# Patient Record
Sex: Female | Born: 1935 | Race: White | Hispanic: No | Marital: Married
Health system: Southern US, Community
[De-identification: ages and names within clinical notes are randomized; demographics above are authoritative.]

---

## 2004-05-17 ENCOUNTER — Other Ambulatory Visit: Payer: Self-pay

## 2004-05-17 ENCOUNTER — Emergency Department: Payer: Self-pay | Admitting: Emergency Medicine

## 2004-05-21 ENCOUNTER — Ambulatory Visit: Payer: Self-pay

## 2004-07-24 ENCOUNTER — Emergency Department: Payer: Self-pay | Admitting: Emergency Medicine

## 2004-08-10 ENCOUNTER — Encounter: Payer: Self-pay | Admitting: Family Medicine

## 2004-08-22 ENCOUNTER — Encounter: Payer: Self-pay | Admitting: Family Medicine

## 2005-01-26 ENCOUNTER — Ambulatory Visit: Payer: Self-pay | Admitting: Family Medicine

## 2005-07-22 ENCOUNTER — Emergency Department: Payer: Self-pay | Admitting: Emergency Medicine

## 2005-10-31 ENCOUNTER — Other Ambulatory Visit: Payer: Self-pay

## 2005-11-01 ENCOUNTER — Inpatient Hospital Stay: Payer: Self-pay

## 2006-02-22 ENCOUNTER — Ambulatory Visit: Payer: Self-pay | Admitting: Family Medicine

## 2006-10-05 ENCOUNTER — Encounter: Payer: Self-pay | Admitting: Family Medicine

## 2006-10-23 ENCOUNTER — Encounter: Payer: Self-pay | Admitting: Family Medicine

## 2006-11-22 ENCOUNTER — Encounter: Payer: Self-pay | Admitting: Family Medicine

## 2006-12-23 ENCOUNTER — Encounter: Payer: Self-pay | Admitting: Family Medicine

## 2007-04-07 ENCOUNTER — Ambulatory Visit: Payer: Self-pay | Admitting: Family Medicine

## 2007-05-31 ENCOUNTER — Inpatient Hospital Stay: Payer: Self-pay | Admitting: Internal Medicine

## 2007-05-31 ENCOUNTER — Other Ambulatory Visit: Payer: Self-pay

## 2007-06-07 ENCOUNTER — Encounter: Payer: Self-pay | Admitting: Internal Medicine

## 2007-06-25 ENCOUNTER — Encounter: Payer: Self-pay | Admitting: Internal Medicine

## 2007-07-05 ENCOUNTER — Ambulatory Visit: Payer: Self-pay | Admitting: Family Medicine

## 2007-07-23 ENCOUNTER — Encounter: Payer: Self-pay | Admitting: Internal Medicine

## 2007-08-24 ENCOUNTER — Ambulatory Visit: Payer: Self-pay | Admitting: Family Medicine

## 2007-08-31 ENCOUNTER — Encounter: Payer: Self-pay | Admitting: Internal Medicine

## 2007-09-22 ENCOUNTER — Encounter: Payer: Self-pay | Admitting: Internal Medicine

## 2007-12-07 ENCOUNTER — Ambulatory Visit: Payer: Self-pay | Admitting: Family Medicine

## 2008-03-26 ENCOUNTER — Ambulatory Visit: Payer: Self-pay | Admitting: Family Medicine

## 2008-05-03 ENCOUNTER — Encounter: Payer: Self-pay | Admitting: Family Medicine

## 2008-05-24 ENCOUNTER — Encounter: Payer: Self-pay | Admitting: Family Medicine

## 2008-07-09 ENCOUNTER — Encounter: Payer: Self-pay | Admitting: Family Medicine

## 2008-07-15 ENCOUNTER — Ambulatory Visit: Payer: Self-pay | Admitting: Internal Medicine

## 2008-07-22 ENCOUNTER — Encounter: Payer: Self-pay | Admitting: Family Medicine

## 2009-05-19 ENCOUNTER — Encounter: Admission: RE | Admit: 2009-05-19 | Discharge: 2009-05-19 | Payer: Self-pay | Admitting: Neurosurgery

## 2009-07-03 ENCOUNTER — Inpatient Hospital Stay (HOSPITAL_COMMUNITY): Admission: RE | Admit: 2009-07-03 | Discharge: 2009-07-05 | Payer: Self-pay | Admitting: Neurosurgery

## 2009-08-07 ENCOUNTER — Encounter: Admission: RE | Admit: 2009-08-07 | Discharge: 2009-08-07 | Payer: Self-pay | Admitting: Neurosurgery

## 2009-08-26 ENCOUNTER — Encounter: Admission: RE | Admit: 2009-08-26 | Discharge: 2009-08-26 | Payer: Self-pay | Admitting: Neurosurgery

## 2009-09-16 ENCOUNTER — Encounter: Admission: RE | Admit: 2009-09-16 | Discharge: 2009-09-16 | Payer: Self-pay | Admitting: Neurosurgery

## 2009-10-07 ENCOUNTER — Encounter: Admission: RE | Admit: 2009-10-07 | Discharge: 2009-10-07 | Payer: Self-pay | Admitting: Neurosurgery

## 2009-12-23 ENCOUNTER — Emergency Department: Payer: Self-pay | Admitting: Emergency Medicine

## 2010-03-26 ENCOUNTER — Ambulatory Visit: Payer: Self-pay

## 2010-04-15 ENCOUNTER — Encounter: Admission: RE | Admit: 2010-04-15 | Discharge: 2010-04-15 | Payer: Self-pay | Admitting: Neurosurgery

## 2010-08-13 LAB — BASIC METABOLIC PANEL
CO2: 27 mEq/L (ref 19–32)
Calcium: 9.7 mg/dL (ref 8.4–10.5)
Creatinine, Ser: 1.09 mg/dL (ref 0.4–1.2)
GFR calc Af Amer: 59 mL/min — ABNORMAL LOW (ref >60)

## 2010-08-13 LAB — CBC
MCHC: 34.6 g/dL (ref 30.0–36.0)
Platelets: 173 10*3/uL (ref 150–400)
RBC: 4.18 MIL/uL (ref 3.87–5.11)

## 2010-08-13 LAB — TYPE AND SCREEN
ABO/RH(D): O NEG
Antibody Screen: NEGATIVE

## 2010-08-13 LAB — ABO/RH: ABO/RH(D): O NEG

## 2010-11-04 ENCOUNTER — Emergency Department: Payer: Self-pay | Admitting: Emergency Medicine

## 2010-12-23 ENCOUNTER — Emergency Department: Payer: Self-pay

## 2011-01-11 ENCOUNTER — Other Ambulatory Visit: Payer: Self-pay | Admitting: Neurology

## 2011-01-11 DIAGNOSIS — R4189 Other symptoms and signs involving cognitive functions and awareness: Secondary | ICD-10-CM

## 2011-01-17 ENCOUNTER — Ambulatory Visit
Admission: RE | Admit: 2011-01-17 | Discharge: 2011-01-17 | Disposition: A | Discharge status: Home / Self Care | Payer: Medicare Other | Source: Ambulatory Visit | Attending: Neurology | Admitting: Neurology

## 2011-01-17 DIAGNOSIS — R4189 Other symptoms and signs involving cognitive functions and awareness: Secondary | ICD-10-CM

## 2011-04-27 ENCOUNTER — Emergency Department: Payer: Self-pay | Admitting: Emergency Medicine

## 2011-04-30 ENCOUNTER — Ambulatory Visit: Payer: Self-pay | Admitting: Family Medicine

## 2011-05-20 ENCOUNTER — Inpatient Hospital Stay: Payer: Self-pay | Admitting: Internal Medicine

## 2011-05-25 ENCOUNTER — Ambulatory Visit: Payer: Self-pay | Admitting: Internal Medicine

## 2011-05-26 LAB — CBC WITH DIFFERENTIAL/PLATELET
Basophil #: 0 10*3/uL (ref 0.0–0.1)
Basophil %: 0 %
Eosinophil %: 0 %
Lymphocyte %: 5.7 %
MCV: 89 fL (ref 80–100)
Monocyte #: 0.7 10*3/uL (ref 0.0–0.7)
Monocyte %: 3.5 %
Neutrophil #: 18.9 10*3/uL — ABNORMAL HIGH (ref 1.4–6.5)
Neutrophil %: 90.8 %
Platelet: 261 10*3/uL (ref 150–440)
RBC: 4.71 10*6/uL (ref 3.80–5.20)
WBC: 20.8 10*3/uL — ABNORMAL HIGH (ref 3.6–11.0)

## 2011-05-26 LAB — CREATININE, SERUM
Creatinine: 0.99 mg/dL (ref 0.60–1.30)
EGFR (African American): >60

## 2011-05-27 LAB — CBC WITH DIFFERENTIAL/PLATELET
Basophil #: 0.2 10*3/uL — ABNORMAL HIGH (ref 0.0–0.1)
Eosinophil #: 0 10*3/uL (ref 0.0–0.7)
HCT: 34.1 % — ABNORMAL LOW (ref 40.0–52.0)
HGB: 11.2 g/dL — ABNORMAL LOW (ref 12.0–16.0)
Lymphocyte #: 1.6 10*3/uL (ref 1.0–3.6)
MCHC: 32.8 g/dL (ref 32.0–36.0)
MCV: 89 fL (ref 80–100)
Neutrophil #: 26.3 10*3/uL — ABNORMAL HIGH (ref 1.4–6.5)
RBC: 3.86 10*6/uL — ABNORMAL LOW (ref 4.40–5.90)
RDW: 14.2 % (ref 11.5–14.5)
WBC: 28.2 10*3/uL — ABNORMAL HIGH (ref 3.8–10.6)

## 2011-05-27 LAB — BASIC METABOLIC PANEL
Anion Gap: 12 (ref 7–16)
BUN: 86 mg/dL — ABNORMAL HIGH (ref 7–18)
Calcium, Total: 8.3 mg/dL — ABNORMAL LOW (ref 8.5–10.1)
Creatinine: 1.3 mg/dL (ref 0.60–1.30)
EGFR (African American): 51 — ABNORMAL LOW
EGFR (Non-African Amer.): 42 — ABNORMAL LOW
Glucose: 191 mg/dL — ABNORMAL HIGH (ref 65–99)
Osmolality: 313 (ref 275–301)
Potassium: 4.4 mmol/L (ref 3.5–5.1)
Sodium: 141 mmol/L (ref 136–145)

## 2011-05-28 ENCOUNTER — Inpatient Hospital Stay: Payer: Self-pay | Admitting: Specialist

## 2011-05-28 LAB — BASIC METABOLIC PANEL
BUN: 60 mg/dL — ABNORMAL HIGH (ref 7–18)
Calcium, Total: 7.5 mg/dL — ABNORMAL LOW (ref 8.5–10.1)
Co2: 31 mmol/L (ref 21–32)
Creatinine: 1.26 mg/dL (ref 0.60–1.30)
EGFR (African American): 53 — ABNORMAL LOW
EGFR (Non-African Amer.): 44 — ABNORMAL LOW
Glucose: 126 mg/dL — ABNORMAL HIGH (ref 65–99)
Osmolality: 311 (ref 275–301)
Potassium: 3.7 mmol/L (ref 3.5–5.1)
Sodium: 147 mmol/L — ABNORMAL HIGH (ref 136–145)

## 2011-05-28 LAB — CBC WITH DIFFERENTIAL/PLATELET
Basophil %: 0.8 %
Eosinophil #: 0.4 10*3/uL (ref 0.0–0.7)
Eosinophil %: 1.4 %
Lymphocyte #: 3.7 10*3/uL — ABNORMAL HIGH (ref 1.0–3.6)
Lymphocyte %: 14 %
MCV: 89 fL (ref 80–100)
Monocyte %: 0.9 %
Neutrophil #: 22.2 10*3/uL — ABNORMAL HIGH (ref 1.4–6.5)
RBC: 3.25 10*6/uL — ABNORMAL LOW (ref 4.40–5.90)
RDW: 14.4 % (ref 11.5–14.5)
WBC: 26.7 10*3/uL — ABNORMAL HIGH (ref 3.8–10.6)

## 2011-05-28 LAB — HEMOGLOBIN
HGB: 9.9 g/dL — ABNORMAL LOW (ref 12.0–16.0)
HGB: 9 g/dL — ABNORMAL LOW (ref 12.0–16.0)

## 2011-05-28 LAB — TROPONIN I: Troponin-I: 0.03 ng/mL

## 2011-05-28 LAB — CK TOTAL AND CKMB (NOT AT ARMC)
CK, Total: 25 U/L (ref 21–232)
CK, Total: 36 U/L (ref 21–232)
CK, Total: 26 U/L (ref 21–232)
CK-MB: 0.8 ng/mL (ref 0.5–3.6)
CK-MB: 0.8 ng/mL (ref 0.5–3.6)
CK-MB: 1.1 ng/mL (ref 0.5–3.6)

## 2011-05-29 LAB — URINALYSIS, COMPLETE
Bacteria: NONE SEEN
Bilirubin,UR: NEGATIVE
Ketone: NEGATIVE
Protein: NEGATIVE
Specific Gravity: 1.017 (ref 1.003–1.030)
WBC UR: 4 /HPF (ref 0–5)

## 2011-05-29 LAB — BASIC METABOLIC PANEL
Calcium, Total: 7.1 mg/dL — ABNORMAL LOW (ref 8.5–10.1)
Chloride: 111 mmol/L — ABNORMAL HIGH (ref 98–107)
Co2: 31 mmol/L (ref 21–32)
Potassium: 3.9 mmol/L (ref 3.5–5.1)
Sodium: 149 mmol/L — ABNORMAL HIGH (ref 136–145)

## 2011-05-29 LAB — CBC WITH DIFFERENTIAL/PLATELET
Basophil %: 0.9 %
Eosinophil #: 0.7 10*3/uL (ref 0.0–0.7)
Eosinophil %: 3.7 %
HCT: 24.9 % — ABNORMAL LOW (ref 40.0–52.0)
HGB: 8.3 g/dL — ABNORMAL LOW (ref 12.0–16.0)
Lymphocyte %: 17 %
MCH: 29.7 pg (ref 26.0–34.0)
MCHC: 33.4 g/dL (ref 32.0–36.0)
Monocyte %: 3.8 %
Neutrophil #: 13.7 10*3/uL — ABNORMAL HIGH (ref 1.4–6.5)
Platelet: 156 10*3/uL (ref 150–440)
RBC: 2.81 10*6/uL — ABNORMAL LOW (ref 4.40–5.90)

## 2011-05-29 LAB — HEMOGLOBIN A1C: Hemoglobin A1C: 6.4 % — ABNORMAL HIGH (ref 4.2–6.3)

## 2011-05-29 LAB — PROTIME-INR

## 2011-05-30 LAB — CBC WITH DIFFERENTIAL/PLATELET
Basophil %: 0.9 %
Eosinophil #: 1 10*3/uL — ABNORMAL HIGH (ref 0.0–0.7)
Eosinophil %: 4.7 %
HCT: 23.3 % — ABNORMAL LOW (ref 40.0–52.0)
HGB: 7.7 g/dL — ABNORMAL LOW (ref 12.0–16.0)
Lymphocyte #: 2.2 10*3/uL (ref 1.0–3.6)
Lymphocyte %: 10.5 %
MCH: 29.4 pg (ref 26.0–34.0)
MCV: 89 fL (ref 80–100)
Monocyte #: 1.6 10*3/uL — ABNORMAL HIGH (ref 0.0–0.7)
Monocyte %: 7.5 %
Neutrophil #: 16.2 10*3/uL — ABNORMAL HIGH (ref 1.4–6.5)
Platelet: 159 10*3/uL (ref 150–440)
RBC: 2.61 10*6/uL — ABNORMAL LOW (ref 4.40–5.90)
WBC: 21.1 10*3/uL — ABNORMAL HIGH (ref 3.8–10.6)

## 2011-05-30 LAB — BASIC METABOLIC PANEL
Anion Gap: 8 (ref 7–16)
BUN: 13 mg/dL (ref 7–18)
Calcium, Total: 7.4 mg/dL — ABNORMAL LOW (ref 8.5–10.1)
Co2: 28 mmol/L (ref 21–32)
Creatinine: 0.79 mg/dL (ref 0.60–1.30)
EGFR (African American): >60
Glucose: 144 mg/dL — ABNORMAL HIGH (ref 65–99)
Potassium: 3.6 mmol/L (ref 3.5–5.1)
Sodium: 142 mmol/L (ref 136–145)

## 2011-05-31 LAB — CBC WITH DIFFERENTIAL/PLATELET
Basophil #: 0.1 10*3/uL (ref 0.0–0.1)
Basophil %: 0.5 %
Eosinophil #: 0.9 10*3/uL — ABNORMAL HIGH (ref 0.0–0.7)
HGB: 9.2 g/dL — ABNORMAL LOW (ref 12.0–16.0)
Lymphocyte %: 10.9 %
MCHC: 33 g/dL (ref 32.0–36.0)
Monocyte %: 5.1 %
Neutrophil %: 78.6 %
Platelet: 155 10*3/uL (ref 150–440)
RDW: 14.6 % — ABNORMAL HIGH (ref 11.5–14.5)
WBC: 18.2 10*3/uL — ABNORMAL HIGH (ref 3.8–10.6)

## 2011-06-01 LAB — CBC WITH DIFFERENTIAL/PLATELET
Eosinophil: 5 %
HCT: 27.7 % — ABNORMAL LOW (ref 40.0–52.0)
Lymphocytes: 24 %
MCHC: 32.6 g/dL (ref 32.0–36.0)
Monocytes: 6 %
Myelocyte: 2 %
Platelet: 188 10*3/uL (ref 150–440)
RBC: 3.05 10*6/uL — ABNORMAL LOW (ref 4.40–5.90)
RDW: 14.7 % — ABNORMAL HIGH (ref 11.5–14.5)
Segmented Neutrophils: 58 %
WBC: 18 10*3/uL — ABNORMAL HIGH (ref 3.8–10.6)

## 2011-06-01 LAB — BASIC METABOLIC PANEL
Anion Gap: 7 (ref 7–16)
BUN: 6 mg/dL — ABNORMAL LOW (ref 7–18)
Co2: 29 mmol/L (ref 21–32)
Creatinine: 0.83 mg/dL (ref 0.60–1.30)
EGFR (African American): >60
EGFR (Non-African Amer.): >60
Glucose: 88 mg/dL (ref 65–99)
Potassium: 4.1 mmol/L (ref 3.5–5.1)
Sodium: 142 mmol/L (ref 136–145)

## 2011-06-02 LAB — CULTURE, BLOOD (SINGLE)

## 2011-06-03 ENCOUNTER — Ambulatory Visit: Payer: Self-pay | Admitting: Internal Medicine

## 2011-06-04 LAB — PATHOLOGY REPORT

## 2011-06-25 ENCOUNTER — Ambulatory Visit: Payer: Self-pay | Admitting: Internal Medicine

## 2011-06-28 ENCOUNTER — Ambulatory Visit: Payer: Self-pay | Admitting: Internal Medicine

## 2011-06-28 LAB — CBC CANCER CENTER
Basophil: 2 %
Eosinophil #: 0.2 x10 3/mm (ref 0.0–0.7)
Eosinophil %: 1.7 %
Eosinophil: 3 %
HGB: 11.4 g/dL — ABNORMAL LOW (ref 12.0–16.0)
Lymphocyte %: 20.6 %
Lymphocytes: 18 %
Monocyte %: 9.6 %
Monocytes: 11 %
Neutrophil %: 67.8 %
Platelet: 284 x10 3/mm (ref 150–440)
RBC: 4.04 10*6/uL — ABNORMAL LOW (ref 4.40–5.90)
Segmented Neutrophils: 66 %
WBC: 10.6 x10 3/mm (ref 3.8–10.6)

## 2011-06-28 LAB — FOLATE: Folic Acid: 16.9 ng/mL (ref 3.1–100.0)

## 2011-06-28 LAB — FERRITIN: Ferritin (ARMC): 17 ng/mL (ref 8–388)

## 2011-06-28 LAB — RETICULOCYTES
Absolute Retic Count: 0.112 10*6/uL — ABNORMAL HIGH (ref 0.024–0.084)
Reticulocyte: 2.8 % — ABNORMAL HIGH (ref 0.5–1.5)

## 2011-06-28 LAB — LACTATE DEHYDROGENASE: LDH: 199 U/L (ref 87–246)

## 2011-06-29 LAB — PROT IMMUNOELECTROPHORES(ARMC)

## 2011-07-23 ENCOUNTER — Ambulatory Visit: Payer: Self-pay | Admitting: Internal Medicine

## 2011-08-13 ENCOUNTER — Other Ambulatory Visit: Payer: Self-pay | Admitting: Specialist

## 2011-08-13 DIAGNOSIS — M545 Low back pain: Secondary | ICD-10-CM

## 2011-08-15 ENCOUNTER — Ambulatory Visit
Admission: RE | Admit: 2011-08-15 | Discharge: 2011-08-15 | Disposition: A | Discharge status: Home / Self Care | Payer: Medicare Other | Source: Ambulatory Visit | Attending: Specialist | Admitting: Specialist

## 2011-08-15 DIAGNOSIS — M545 Low back pain: Secondary | ICD-10-CM

## 2011-11-23 ENCOUNTER — Emergency Department: Payer: Self-pay | Admitting: Emergency Medicine

## 2011-11-23 LAB — URINALYSIS, COMPLETE
Bilirubin,UR: NEGATIVE
Glucose,UR: NEGATIVE mg/dL (ref 0–75)
Hyaline Cast: 9
Ketone: NEGATIVE
RBC,UR: <1 /HPF (ref 0–5)
Squamous Epithelial: NONE SEEN
WBC UR: 3 /HPF (ref 0–5)

## 2011-11-23 LAB — CBC
MCV: 77 fL — ABNORMAL LOW (ref 80–100)
Platelet: 194 10*3/uL (ref 150–440)
RBC: 4.15 10*6/uL — ABNORMAL LOW (ref 4.40–5.90)
RDW: 19 % — ABNORMAL HIGH (ref 11.5–14.5)
WBC: 10 10*3/uL (ref 3.8–10.6)

## 2011-11-23 LAB — COMPREHENSIVE METABOLIC PANEL
Anion Gap: 7 (ref 7–16)
Bilirubin,Total: 0.3 mg/dL (ref 0.2–1.0)
Chloride: 104 mmol/L (ref 98–107)
Glucose: 114 mg/dL — ABNORMAL HIGH (ref 65–99)
Potassium: 4.6 mmol/L (ref 3.5–5.1)
SGOT(AST): 18 U/L (ref 15–37)
Total Protein: 6.9 g/dL (ref 6.4–8.2)

## 2011-11-23 LAB — TROPONIN I: Troponin-I: <0.02 ng/mL

## 2012-01-04 ENCOUNTER — Ambulatory Visit: Payer: Self-pay | Admitting: Internal Medicine

## 2012-01-04 LAB — CBC CANCER CENTER
Eosinophil %: 4.7 %
Monocyte %: 11.9 %
Neutrophil %: 56.7 %
Platelet: 232 x10 3/mm (ref 150–440)
RBC: 4.44 10*6/uL (ref 3.80–5.20)
WBC: 11.4 x10 3/mm — ABNORMAL HIGH (ref 3.6–11.0)

## 2012-01-04 LAB — RETICULOCYTES
Absolute Retic Count: 0.0729 10*6/uL (ref 0.023–0.096)
Reticulocyte: 1.64 % (ref 0.5–2.2)

## 2012-01-23 ENCOUNTER — Ambulatory Visit: Payer: Self-pay | Admitting: Internal Medicine

## 2012-04-10 ENCOUNTER — Emergency Department: Payer: Self-pay | Admitting: Emergency Medicine

## 2012-04-10 LAB — URINALYSIS, COMPLETE
Glucose,UR: NEGATIVE mg/dL (ref 0–75)
Ketone: NEGATIVE
Nitrite: NEGATIVE
Ph: 6 (ref 4.5–8.0)
Protein: NEGATIVE
Specific Gravity: 1.018 (ref 1.003–1.030)

## 2012-04-10 LAB — CBC
HCT: 38.2 % (ref 35.0–47.0)
MCHC: 33.2 g/dL (ref 32.0–36.0)

## 2012-04-10 LAB — BASIC METABOLIC PANEL
Anion Gap: 6 — ABNORMAL LOW (ref 7–16)
BUN: 20 mg/dL — ABNORMAL HIGH (ref 7–18)
Chloride: 105 mmol/L (ref 98–107)
Creatinine: 0.83 mg/dL (ref 0.60–1.30)
Potassium: 4.3 mmol/L (ref 3.5–5.1)
Sodium: 139 mmol/L (ref 136–145)

## 2012-05-03 ENCOUNTER — Emergency Department: Payer: Self-pay

## 2012-05-03 LAB — COMPREHENSIVE METABOLIC PANEL
Alkaline Phosphatase: 139 U/L — ABNORMAL HIGH (ref 50–136)
Anion Gap: 10 (ref 7–16)
Calcium, Total: 8.1 mg/dL — ABNORMAL LOW (ref 8.5–10.1)
Co2: 25 mmol/L (ref 21–32)
Osmolality: 283 (ref 275–301)
Potassium: 4 mmol/L (ref 3.5–5.1)
Sodium: 139 mmol/L (ref 136–145)

## 2012-05-03 LAB — URINALYSIS, COMPLETE
Bacteria: NONE SEEN
Glucose,UR: NEGATIVE mg/dL (ref 0–75)
Hyaline Cast: 5
Leukocyte Esterase: NEGATIVE
Nitrite: NEGATIVE
Protein: NEGATIVE
Specific Gravity: 1.006 (ref 1.003–1.030)
WBC UR: 1 /HPF (ref 0–5)

## 2012-05-03 LAB — TROPONIN I: Troponin-I: <0.02 ng/mL

## 2012-05-03 LAB — CBC
HCT: 42 % (ref 35.0–47.0)
RBC: 4.88 10*6/uL (ref 3.80–5.20)
WBC: 11.3 10*3/uL — ABNORMAL HIGH (ref 3.6–11.0)

## 2012-05-03 LAB — CK TOTAL AND CKMB (NOT AT ARMC): CK-MB: 1.6 ng/mL (ref 0.5–3.6)

## 2012-05-09 ENCOUNTER — Emergency Department: Payer: Self-pay | Admitting: Emergency Medicine

## 2012-05-09 LAB — COMPREHENSIVE METABOLIC PANEL
Albumin: 3.4 g/dL (ref 3.4–5.0)
Alkaline Phosphatase: 127 U/L (ref 50–136)
Bilirubin,Total: 0.4 mg/dL (ref 0.2–1.0)
Chloride: 106 mmol/L (ref 98–107)
Co2: 30 mmol/L (ref 21–32)
Creatinine: 1.19 mg/dL (ref 0.60–1.30)
EGFR (African American): 50 — ABNORMAL LOW
Glucose: 99 mg/dL (ref 65–99)
Potassium: 4.2 mmol/L (ref 3.5–5.1)
SGOT(AST): 24 U/L (ref 15–37)
SGPT (ALT): 26 U/L (ref 12–78)
Total Protein: 6.8 g/dL (ref 6.4–8.2)

## 2012-05-09 LAB — CBC
HCT: 40.2 % (ref 35.0–47.0)
HGB: 13 g/dL (ref 12.0–16.0)
Platelet: 149 10*3/uL — ABNORMAL LOW (ref 150–440)
RBC: 4.63 10*6/uL (ref 3.80–5.20)
RDW: 15.1 % — ABNORMAL HIGH (ref 11.5–14.5)
WBC: 9.9 10*3/uL (ref 3.6–11.0)

## 2012-05-09 LAB — URINALYSIS, COMPLETE
Bacteria: NONE SEEN
Bilirubin,UR: NEGATIVE
Glucose,UR: 50 mg/dL (ref 0–75)
Leukocyte Esterase: NEGATIVE
Nitrite: NEGATIVE
RBC,UR: 20 /HPF (ref 0–5)
Squamous Epithelial: 4
WBC UR: 2 /HPF (ref 0–5)

## 2012-05-09 LAB — TSH: Thyroid Stimulating Horm: 3.49 u[IU]/mL

## 2012-06-02 ENCOUNTER — Ambulatory Visit: Payer: Self-pay | Admitting: Neurology

## 2012-06-19 ENCOUNTER — Emergency Department: Payer: Self-pay | Admitting: Emergency Medicine

## 2012-06-19 LAB — CBC
MCH: 29.4 pg (ref 26.0–34.0)
MCHC: 33.2 g/dL (ref 32.0–36.0)
RBC: 4.78 10*6/uL (ref 3.80–5.20)
RDW: 14.5 % (ref 11.5–14.5)
WBC: 10.3 10*3/uL (ref 3.6–11.0)

## 2012-06-19 LAB — URINALYSIS, COMPLETE
Blood: NEGATIVE
Glucose,UR: NEGATIVE mg/dL (ref 0–75)
Hyaline Cast: 3
Leukocyte Esterase: NEGATIVE
Nitrite: NEGATIVE
Ph: 5 (ref 4.5–8.0)
Protein: NEGATIVE
Specific Gravity: 1.012 (ref 1.003–1.030)

## 2012-06-19 LAB — COMPREHENSIVE METABOLIC PANEL
Albumin: 3.6 g/dL (ref 3.4–5.0)
Anion Gap: 6 — ABNORMAL LOW (ref 7–16)
BUN: 25 mg/dL — ABNORMAL HIGH (ref 7–18)
Bilirubin,Total: 0.3 mg/dL (ref 0.2–1.0)
Calcium, Total: 8.6 mg/dL (ref 8.5–10.1)
Chloride: 106 mmol/L (ref 98–107)
Creatinine: 1.41 mg/dL — ABNORMAL HIGH (ref 0.60–1.30)
EGFR (African American): 41 — ABNORMAL LOW
EGFR (Non-African Amer.): 35 — ABNORMAL LOW
Osmolality: 286 (ref 275–301)
SGPT (ALT): 38 U/L (ref 12–78)
Sodium: 141 mmol/L (ref 136–145)
Total Protein: 7.3 g/dL (ref 6.4–8.2)

## 2012-06-19 LAB — CK TOTAL AND CKMB (NOT AT ARMC)
CK, Total: 79 U/L (ref 21–215)
CK-MB: 1.6 ng/mL (ref 0.5–3.6)

## 2012-06-19 LAB — TROPONIN I: Troponin-I: <0.02 ng/mL

## 2012-06-29 ENCOUNTER — Inpatient Hospital Stay: Payer: Self-pay | Admitting: Internal Medicine

## 2012-06-29 LAB — CBC
MCH: 28.2 pg (ref 26.0–34.0)
MCHC: 32 g/dL (ref 32.0–36.0)
Platelet: 205 10*3/uL (ref 150–440)
RBC: 4.59 10*6/uL (ref 3.80–5.20)
WBC: 10.5 10*3/uL (ref 3.6–11.0)

## 2012-06-29 LAB — COMPREHENSIVE METABOLIC PANEL
Albumin: 2.8 g/dL — ABNORMAL LOW (ref 3.4–5.0)
Anion Gap: 9 (ref 7–16)
BUN: 23 mg/dL — ABNORMAL HIGH (ref 7–18)
Bilirubin,Total: 0.2 mg/dL (ref 0.2–1.0)
Chloride: 114 mmol/L — ABNORMAL HIGH (ref 98–107)
Co2: 24 mmol/L (ref 21–32)
Creatinine: 1.14 mg/dL (ref 0.60–1.30)
Osmolality: 296 (ref 275–301)
Potassium: 3.2 mmol/L — ABNORMAL LOW (ref 3.5–5.1)
SGPT (ALT): 29 U/L (ref 12–78)
Sodium: 147 mmol/L — ABNORMAL HIGH (ref 136–145)

## 2012-06-29 LAB — TROPONIN I: Troponin-I: <0.02 ng/mL

## 2012-06-29 LAB — PRO B NATRIURETIC PEPTIDE: B-Type Natriuretic Peptide: 525 pg/mL — ABNORMAL HIGH (ref 0–450)

## 2012-06-29 LAB — CK TOTAL AND CKMB (NOT AT ARMC)
CK, Total: 94 U/L (ref 21–215)
CK-MB: 1 ng/mL (ref 0.5–3.6)

## 2012-06-30 LAB — CBC WITH DIFFERENTIAL/PLATELET
Basophil #: 0 10*3/uL (ref 0.0–0.1)
Basophil %: 0.3 %
Eosinophil #: 0 10*3/uL (ref 0.0–0.7)
Eosinophil %: 0.1 %
Lymphocyte #: 0.7 10*3/uL — ABNORMAL LOW (ref 1.0–3.6)
MCV: 88 fL (ref 80–100)
Monocyte #: 0.1 x10 3/mm — ABNORMAL LOW (ref 0.2–0.9)
Monocyte %: 1.4 %
Neutrophil #: 7.5 10*3/uL — ABNORMAL HIGH (ref 1.4–6.5)
Neutrophil %: 90.2 %
Platelet: 176 10*3/uL (ref 150–440)
RBC: 4.42 10*6/uL (ref 3.80–5.20)
RDW: 14.4 % (ref 11.5–14.5)
WBC: 8.3 10*3/uL (ref 3.6–11.0)

## 2012-06-30 LAB — BASIC METABOLIC PANEL
Calcium, Total: 8.2 mg/dL — ABNORMAL LOW (ref 8.5–10.1)
Creatinine: 1.42 mg/dL — ABNORMAL HIGH (ref 0.60–1.30)
EGFR (African American): 41 — ABNORMAL LOW
EGFR (Non-African Amer.): 35 — ABNORMAL LOW
Osmolality: 294 (ref 275–301)
Sodium: 143 mmol/L (ref 136–145)

## 2012-06-30 LAB — LIPID PANEL
Ldl Cholesterol, Calc: 118 mg/dL — ABNORMAL HIGH (ref 0–100)
Triglycerides: 64 mg/dL (ref 0–200)
VLDL Cholesterol, Calc: 13 mg/dL (ref 5–40)

## 2012-06-30 LAB — CK TOTAL AND CKMB (NOT AT ARMC)
CK-MB: 1.1 ng/mL (ref 0.5–3.6)
CK-MB: 1 ng/mL (ref 0.5–3.6)

## 2012-06-30 LAB — TROPONIN I: Troponin-I: <0.02 ng/mL

## 2012-07-01 LAB — BASIC METABOLIC PANEL
Anion Gap: 6 — ABNORMAL LOW (ref 7–16)
Co2: 30 mmol/L (ref 21–32)
Creatinine: 1.32 mg/dL — ABNORMAL HIGH (ref 0.60–1.30)
EGFR (Non-African Amer.): 38 — ABNORMAL LOW
Potassium: 4 mmol/L (ref 3.5–5.1)
Sodium: 141 mmol/L (ref 136–145)

## 2012-07-01 LAB — TSH: Thyroid Stimulating Horm: 1.07 u[IU]/mL

## 2012-07-02 ENCOUNTER — Ambulatory Visit: Payer: Self-pay | Admitting: Neurology

## 2012-07-02 LAB — BASIC METABOLIC PANEL
Anion Gap: 6 — ABNORMAL LOW (ref 7–16)
Calcium, Total: 8.4 mg/dL — ABNORMAL LOW (ref 8.5–10.1)
Chloride: 105 mmol/L (ref 98–107)
Co2: 30 mmol/L (ref 21–32)
EGFR (African American): 40 — ABNORMAL LOW
Glucose: 102 mg/dL — ABNORMAL HIGH (ref 65–99)
Osmolality: 290 (ref 275–301)
Potassium: 4.4 mmol/L (ref 3.5–5.1)
Sodium: 141 mmol/L (ref 136–145)

## 2012-07-04 ENCOUNTER — Encounter: Payer: Self-pay | Admitting: Internal Medicine

## 2012-07-11 LAB — BASIC METABOLIC PANEL
Anion Gap: 6 — ABNORMAL LOW (ref 7–16)
Calcium, Total: 8.4 mg/dL — ABNORMAL LOW (ref 8.5–10.1)
EGFR (African American): 51 — ABNORMAL LOW
Glucose: 92 mg/dL (ref 65–99)
Potassium: 4.2 mmol/L (ref 3.5–5.1)

## 2012-07-22 ENCOUNTER — Encounter: Payer: Self-pay | Admitting: Internal Medicine

## 2012-08-06 LAB — CBC
HCT: 40.4 % (ref 35.0–47.0)
HGB: 13 g/dL (ref 12.0–16.0)
Platelet: 140 10*3/uL — ABNORMAL LOW (ref 150–440)
RDW: 14.5 % (ref 11.5–14.5)
WBC: 7.9 10*3/uL (ref 3.6–11.0)

## 2012-08-07 ENCOUNTER — Inpatient Hospital Stay: Payer: Self-pay | Admitting: Family Medicine

## 2012-08-07 LAB — COMPREHENSIVE METABOLIC PANEL
Albumin: 3.3 g/dL — ABNORMAL LOW (ref 3.4–5.0)
Alkaline Phosphatase: 134 U/L (ref 50–136)
BUN: 29 mg/dL — ABNORMAL HIGH (ref 7–18)
Bilirubin,Total: 0.3 mg/dL (ref 0.2–1.0)
Chloride: 105 mmol/L (ref 98–107)
Co2: 28 mmol/L (ref 21–32)
EGFR (Non-African Amer.): 34 — ABNORMAL LOW
Glucose: 107 mg/dL — ABNORMAL HIGH (ref 65–99)
SGOT(AST): 45 U/L — ABNORMAL HIGH (ref 15–37)
Sodium: 139 mmol/L (ref 136–145)
Total Protein: 7.2 g/dL (ref 6.4–8.2)

## 2012-08-07 LAB — CK TOTAL AND CKMB (NOT AT ARMC): CK, Total: 33 U/L (ref 21–215)

## 2012-08-07 LAB — PRO B NATRIURETIC PEPTIDE: B-Type Natriuretic Peptide: 690 pg/mL — ABNORMAL HIGH (ref 0–450)

## 2012-08-08 LAB — BASIC METABOLIC PANEL
BUN: 36 mg/dL — ABNORMAL HIGH (ref 7–18)
Calcium, Total: 8.6 mg/dL (ref 8.5–10.1)
Chloride: 105 mmol/L (ref 98–107)
Co2: 27 mmol/L (ref 21–32)
EGFR (Non-African Amer.): 50 — ABNORMAL LOW
Glucose: 152 mg/dL — ABNORMAL HIGH (ref 65–99)
Osmolality: 293 (ref 275–301)
Potassium: 4.2 mmol/L (ref 3.5–5.1)
Sodium: 141 mmol/L (ref 136–145)

## 2012-08-08 LAB — CBC WITH DIFFERENTIAL/PLATELET
Basophil #: 0 10*3/uL (ref 0.0–0.1)
Basophil %: 0.1 %
Eosinophil #: 0 10*3/uL (ref 0.0–0.7)
Eosinophil %: 0 %
HCT: 40.1 % (ref 35.0–47.0)
HGB: 12.8 g/dL (ref 12.0–16.0)
MCH: 28.4 pg (ref 26.0–34.0)
MCV: 89 fL (ref 80–100)
RDW: 14.6 % — ABNORMAL HIGH (ref 11.5–14.5)
WBC: 17 10*3/uL — ABNORMAL HIGH (ref 3.6–11.0)

## 2012-08-22 ENCOUNTER — Ambulatory Visit: Payer: Self-pay | Admitting: Internal Medicine

## 2012-09-07 ENCOUNTER — Observation Stay: Payer: Self-pay | Admitting: Internal Medicine

## 2012-09-07 LAB — COMPREHENSIVE METABOLIC PANEL
Alkaline Phosphatase: 140 U/L — ABNORMAL HIGH (ref 50–136)
Anion Gap: 4 — ABNORMAL LOW (ref 7–16)
Bilirubin,Total: 0.3 mg/dL (ref 0.2–1.0)
Chloride: 105 mmol/L (ref 98–107)
Co2: 31 mmol/L (ref 21–32)
Creatinine: 1.35 mg/dL — ABNORMAL HIGH (ref 0.60–1.30)
EGFR (African American): 43 — ABNORMAL LOW
EGFR (Non-African Amer.): 37 — ABNORMAL LOW
Glucose: 100 mg/dL — ABNORMAL HIGH (ref 65–99)
Osmolality: 285 (ref 275–301)
Potassium: 5 mmol/L (ref 3.5–5.1)
SGOT(AST): 33 U/L (ref 15–37)
Total Protein: 6.8 g/dL (ref 6.4–8.2)

## 2012-09-07 LAB — URINALYSIS, COMPLETE
Bilirubin,UR: NEGATIVE
Blood: NEGATIVE
Glucose,UR: NEGATIVE mg/dL (ref 0–75)
Ketone: NEGATIVE
Leukocyte Esterase: NEGATIVE
Nitrite: NEGATIVE
Ph: 6 (ref 4.5–8.0)
Protein: NEGATIVE
Squamous Epithelial: NONE SEEN

## 2012-09-07 LAB — TROPONIN I: Troponin-I: <0.02 ng/mL

## 2012-09-07 LAB — CBC
HGB: 12 g/dL (ref 12.0–16.0)
MCHC: 33.1 g/dL (ref 32.0–36.0)
MCV: 89 fL (ref 80–100)
Platelet: 187 10*3/uL (ref 150–440)
RDW: 14.3 % (ref 11.5–14.5)

## 2012-09-08 LAB — TROPONIN I: Troponin-I: <0.02 ng/mL

## 2012-09-13 LAB — CBC CANCER CENTER
Basophil #: 0.1 x10 3/mm (ref 0.0–0.1)
Basophil %: 1.2 %
Eosinophil #: 0.2 x10 3/mm (ref 0.0–0.7)
Eosinophil %: 1.8 %
HGB: 12.7 g/dL (ref 12.0–16.0)
Lymphocyte #: 1.7 x10 3/mm (ref 1.0–3.6)
Lymphocyte %: 19.7 %
MCH: 29.4 pg (ref 26.0–34.0)
MCV: 89 fL (ref 80–100)
Monocyte %: 12.2 %
Neutrophil #: 5.5 x10 3/mm (ref 1.4–6.5)
Platelet: 229 x10 3/mm (ref 150–440)
WBC: 8.5 x10 3/mm (ref 3.6–11.0)

## 2012-09-13 LAB — IRON AND TIBC
Iron Saturation: 18 %
Iron: 54 ug/dL (ref 50–170)
Unbound Iron-Bind.Cap.: 251 ug/dL

## 2012-09-20 ENCOUNTER — Ambulatory Visit: Payer: Self-pay | Admitting: Specialist

## 2012-09-21 ENCOUNTER — Ambulatory Visit: Payer: Self-pay | Admitting: Internal Medicine

## 2012-10-02 ENCOUNTER — Emergency Department: Payer: Self-pay | Admitting: Emergency Medicine

## 2012-10-06 ENCOUNTER — Ambulatory Visit: Payer: Self-pay | Admitting: Family Medicine

## 2013-01-22 DEATH — deceased

## 2013-12-09 IMAGING — CR DG CHEST 1V PORT
1 series · 1 of 1 positions shown · non-contrast
Comparison: none

REASON FOR EXAM: sob
COMMENTS:

[ap]
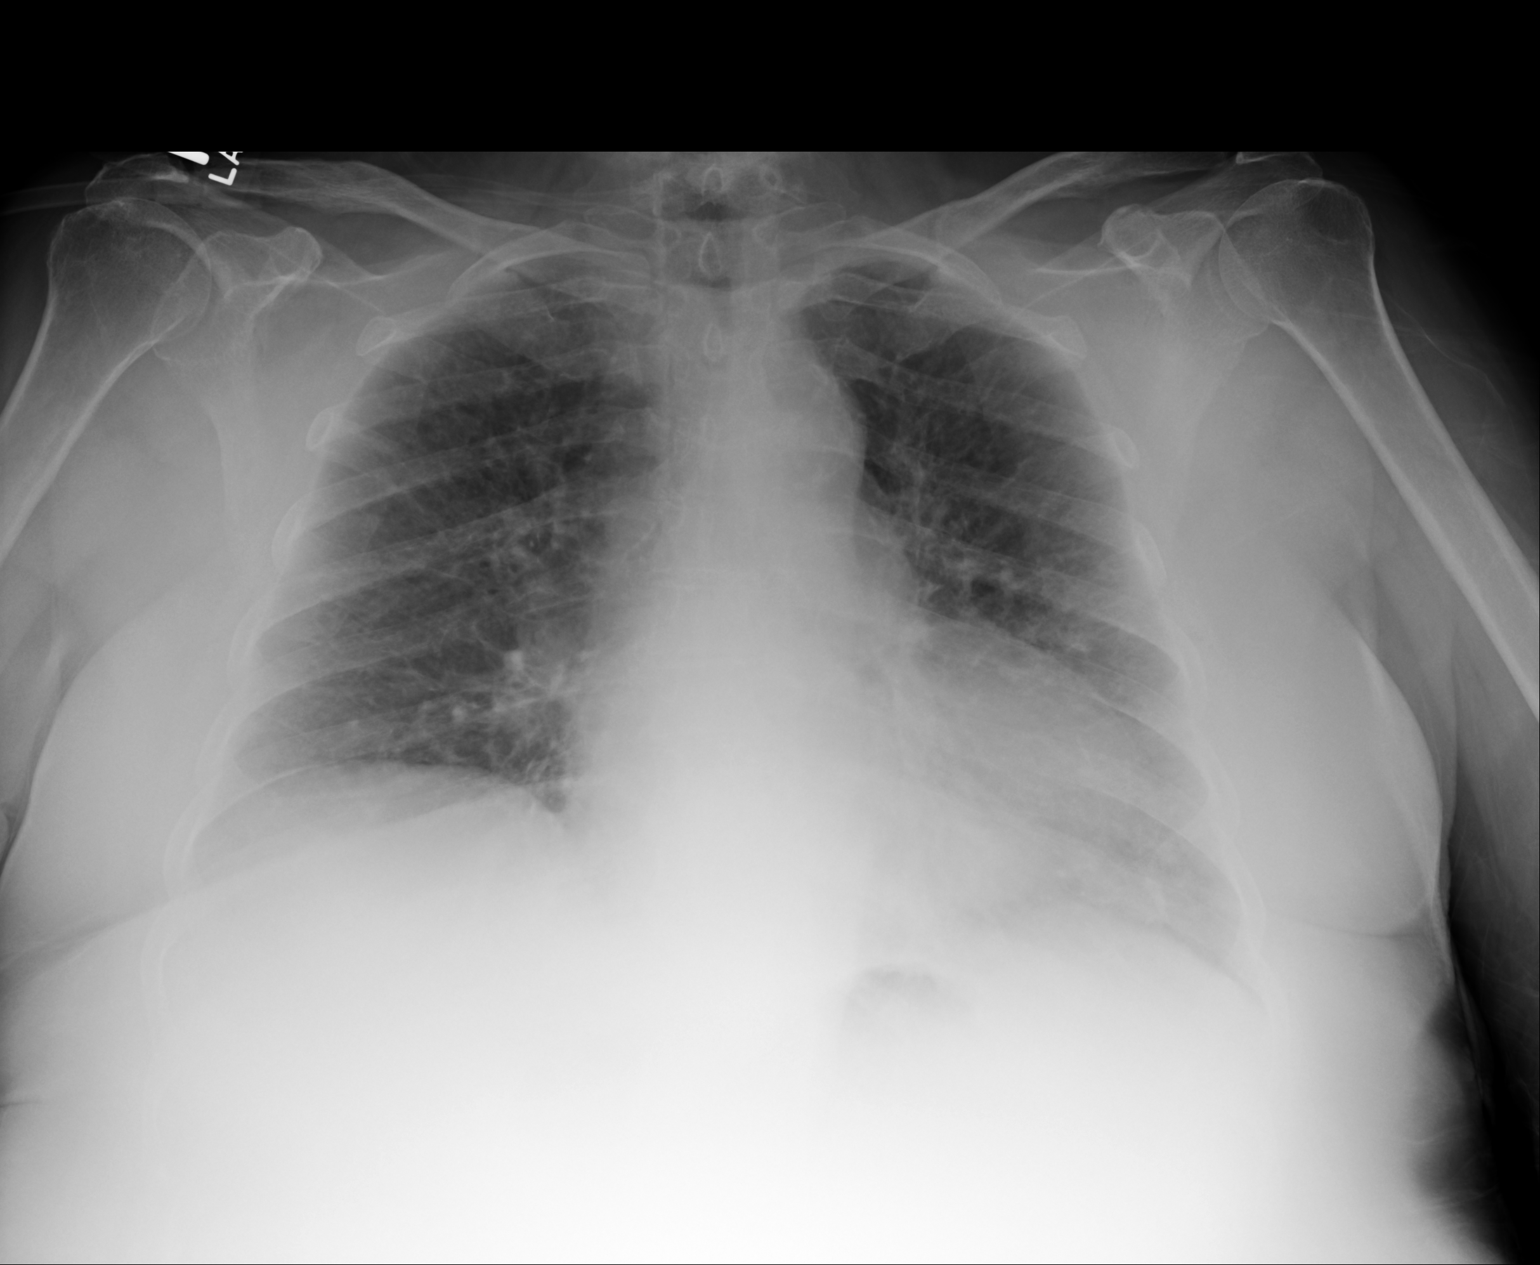

[1 of 1 positions shown; findings below may reference images not displayed]

PROCEDURE:     DXR - DXR PORTABLE CHEST SINGLE VIEW  - June 29, 2012  [DATE]

RESULT:     Comparison is made to the study 19 June, 2012.

The right hemidiaphragm remains higher than the left. The lungs are mildly
hypoinflated. The interstitial markings are mildly increased but not greatly
changed from the earlier study. The cardiac silhouette remains enlarged.
There is no pleural effusion or pneumothorax.
IMPRESSION: The study is limited due to hypoinflation. I cannot exclude
low-grade CHF with very mild interstitial edema.

[REDACTED]

## 2014-01-18 IMAGING — CR DG CHEST 1V PORT
1 series · 1 of 1 positions shown · non-contrast
Comparison: none

REASON FOR EXAM: respiratory distress/copd flare
COMMENTS:

[ap]
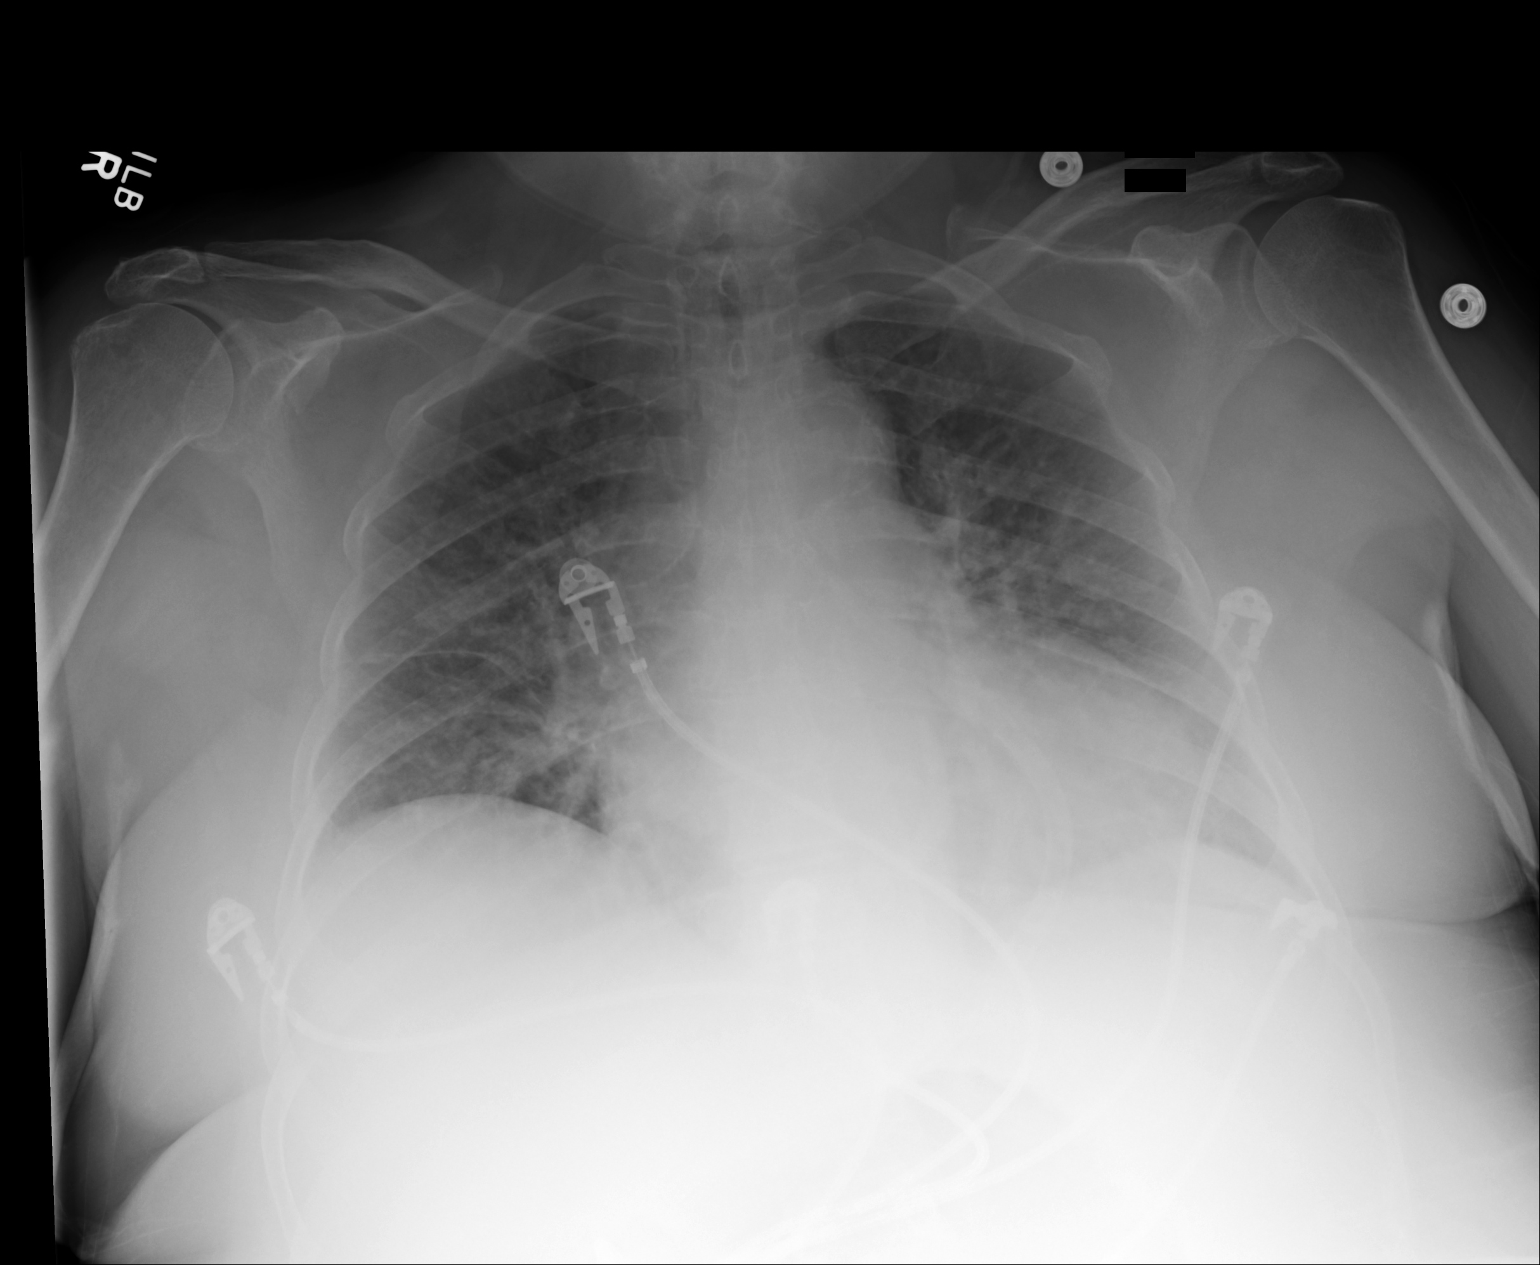

[1 of 1 positions shown; findings below may reference images not displayed]

PROCEDURE:     DXR - DXR PORTABLE CHEST SINGLE VIEW  - August 08, 2012  [DATE]

RESULT:     Comparison is made to the study August 07, 2012.

The lungs are slightly less well inflated today. The interstitial markings
remain increased. The cardiac silhouette is enlarged and the left
ventricular margins are less well-defined. The central pulmonary vascularity
remains indistinct.
IMPRESSION: The findings are worrisome for interstitial edema likely of
cardiac cause. Even allowing for the changes from the AP to the AP portable
techniques there has been mild interval deterioration in the appearance of
the pulmonary interstitium.

[REDACTED]

## 2014-03-14 IMAGING — CR DG HIP COMPLETE 2+V*L*
1 series · 2 of 2 positions shown · non-contrast
Comparison: none

REASON FOR EXAM: groin pain, fell two weeks ago
COMMENTS:

[Series 5: t hip ap left · 0.14mm/px · 2 of 2 slices shown]
[im 1/2]
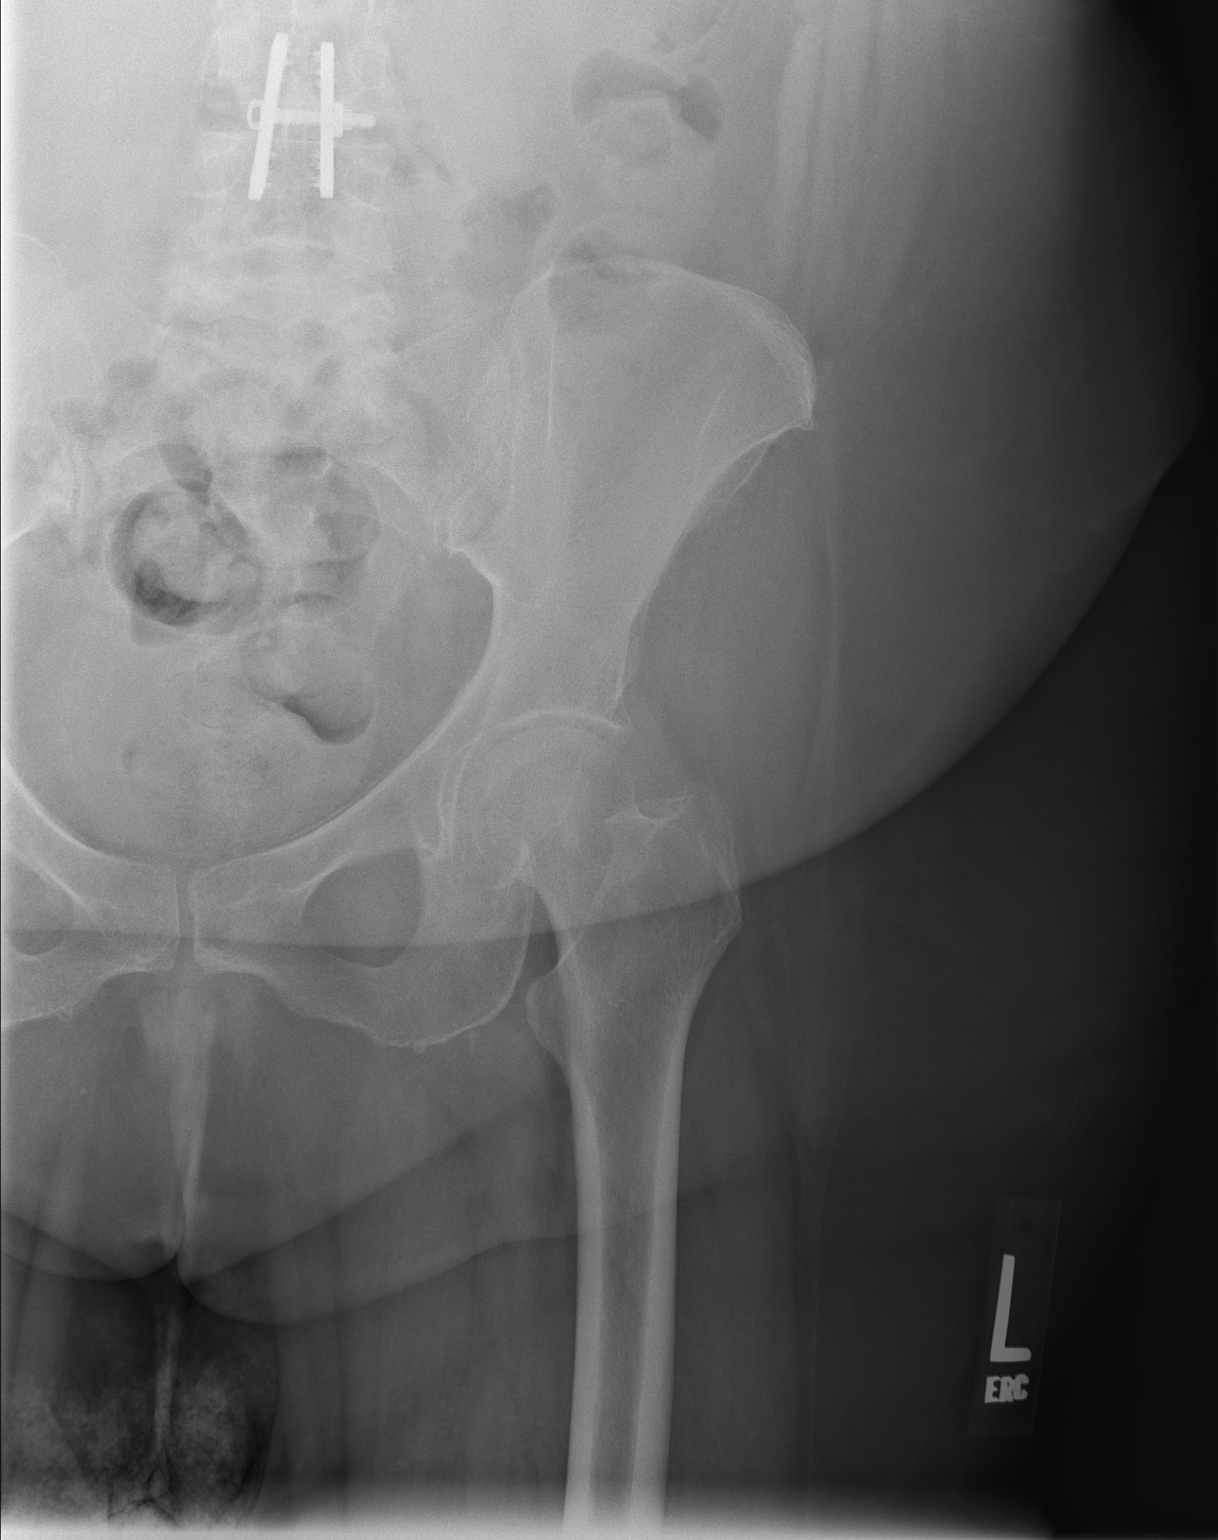
[im 2/2]
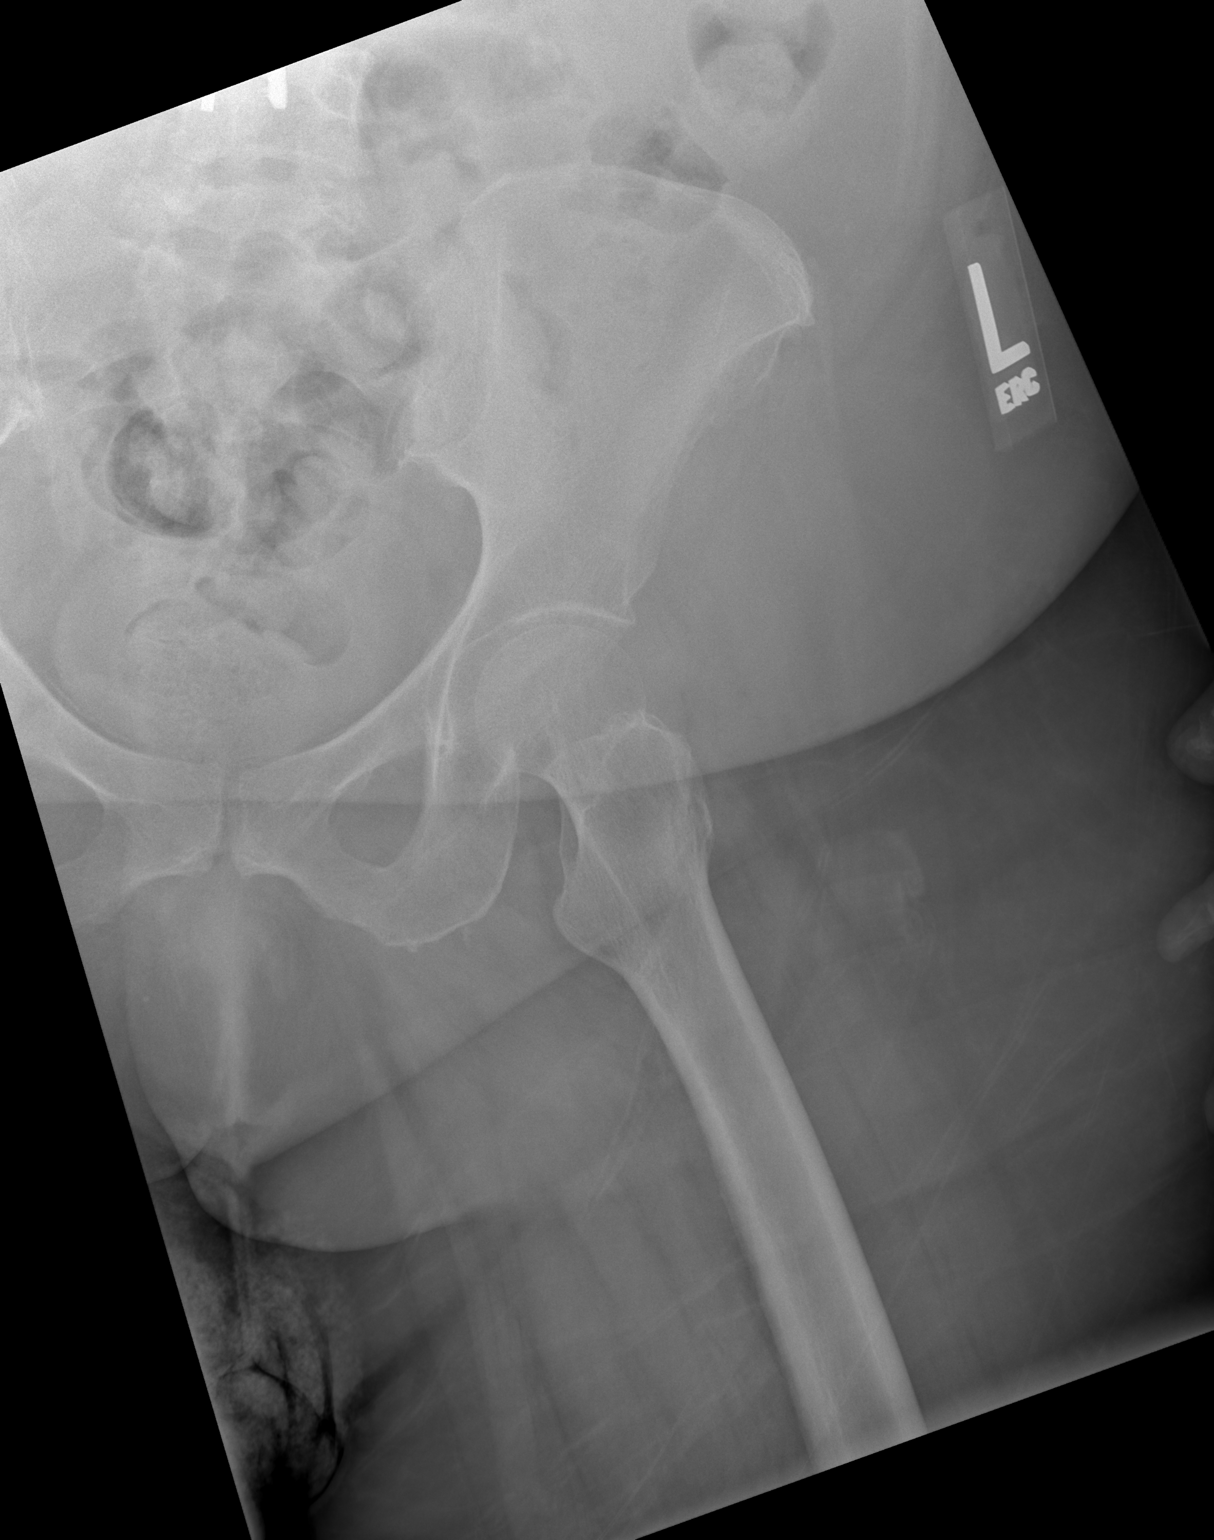

[2 of 2 positions shown; findings below may reference images not displayed]

PROCEDURE:     DXR - DXR HIP LEFT COMPLETE  - October 02, 2012  [DATE]

RESULT:     AP and frog-leg lateral views of the left hip reveal the bones
to be reasonably well mineralized for age. No acute fracture is
demonstrated. There is no more than mild age related degenerative change.
The observed portions of the left hemipelvis appear intact.
IMPRESSION: No acute bony abnormality of the left hip is demonstrated.
Followup CT scanning or MRI are available if felt clinically indicated.

[REDACTED]

## 2014-03-14 IMAGING — CR PELVIS - 1-2 VIEW
1 series · 1 of 1 positions shown · non-contrast
Comparison: none

REASON FOR EXAM: groin pain, fell two weeks ago
COMMENTS:

PROCEDURE:     DXR - DXR PELVIS AP ONLY  - October 02, 2012  [DATE]
RESULT:     The bony pelvis is mildly osteopenic. The bowel gas pattern is
within the limits of normal. As best as can be determined the hips are
intact.

[t pelvis ap]
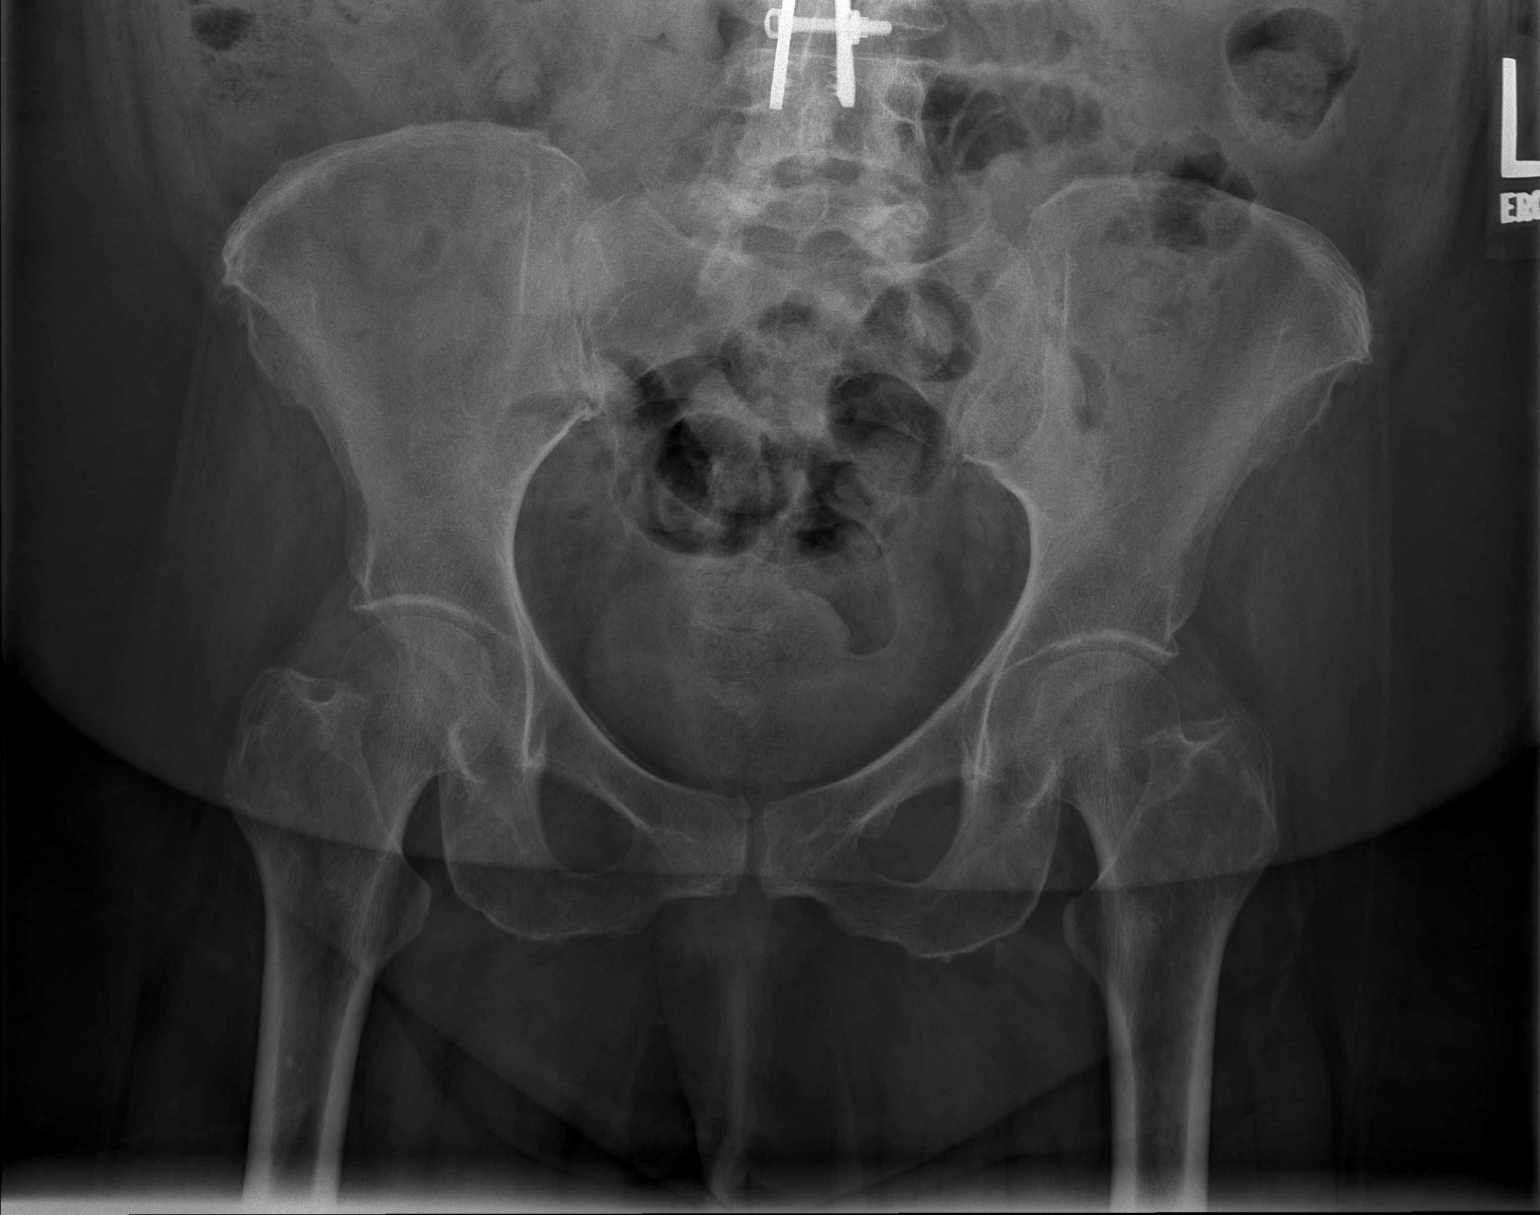

[1 of 1 positions shown; findings below may reference images not displayed]

IMPRESSION: There is no acute bony abnormality of the pelvis
demonstrated on this study. If there are strong clinical concerns of an
occult fracture, CT scanning is available upon reque[REDACTED]

## 2014-03-18 IMAGING — CR DG SHOULDER 3+V*R*
1 series · 4 of 4 positions shown · non-contrast
Comparison: none

REASON FOR EXAM: pain
COMMENTS:

[Series 1: internal rotate · 0.17mm/px · 4 of 4 slices shown]
[im 1/4]
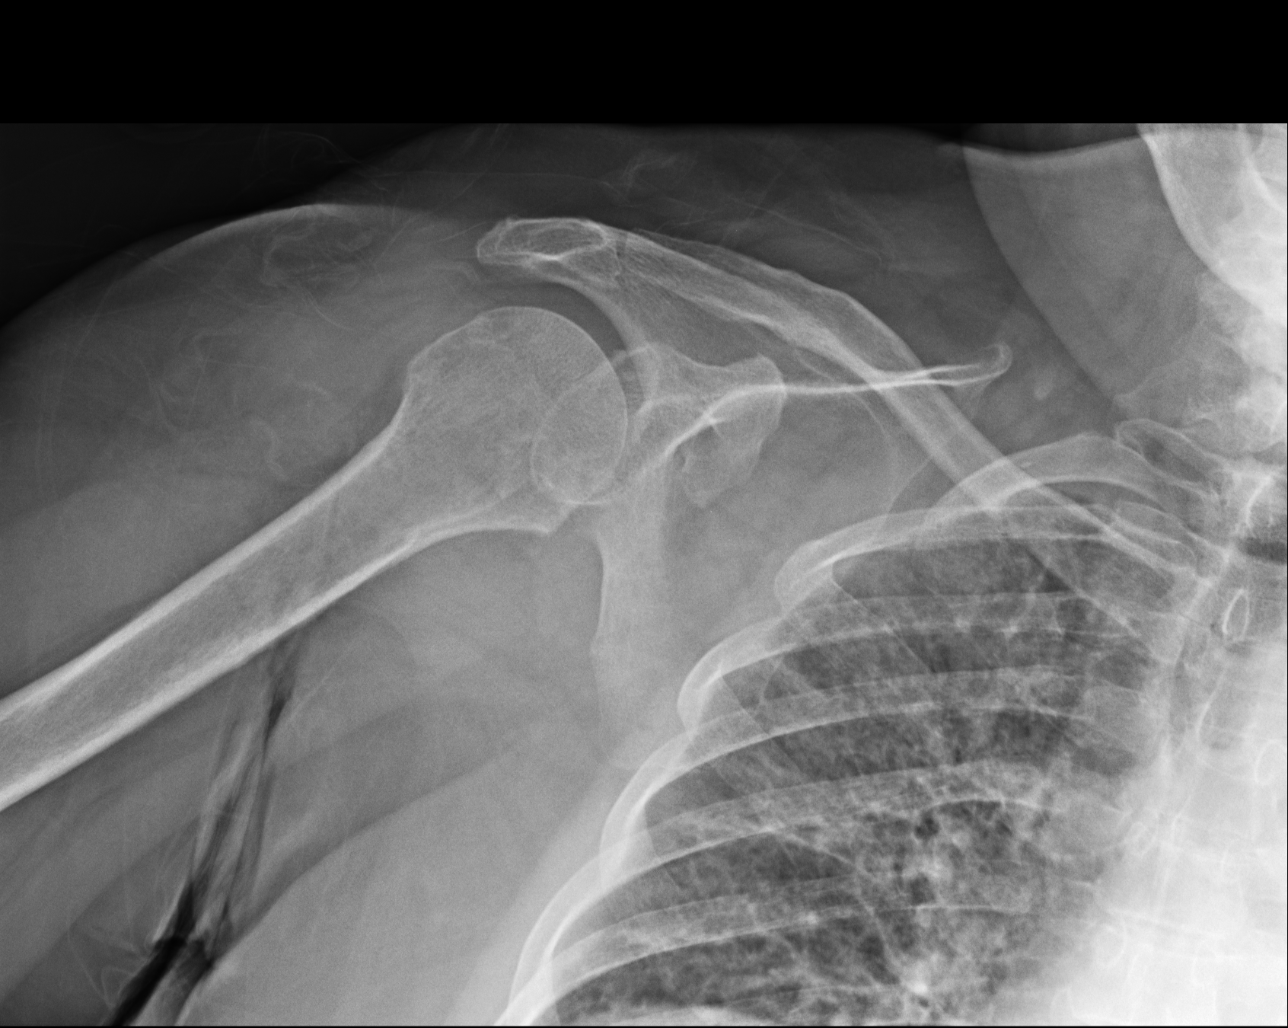
[im 2/4]
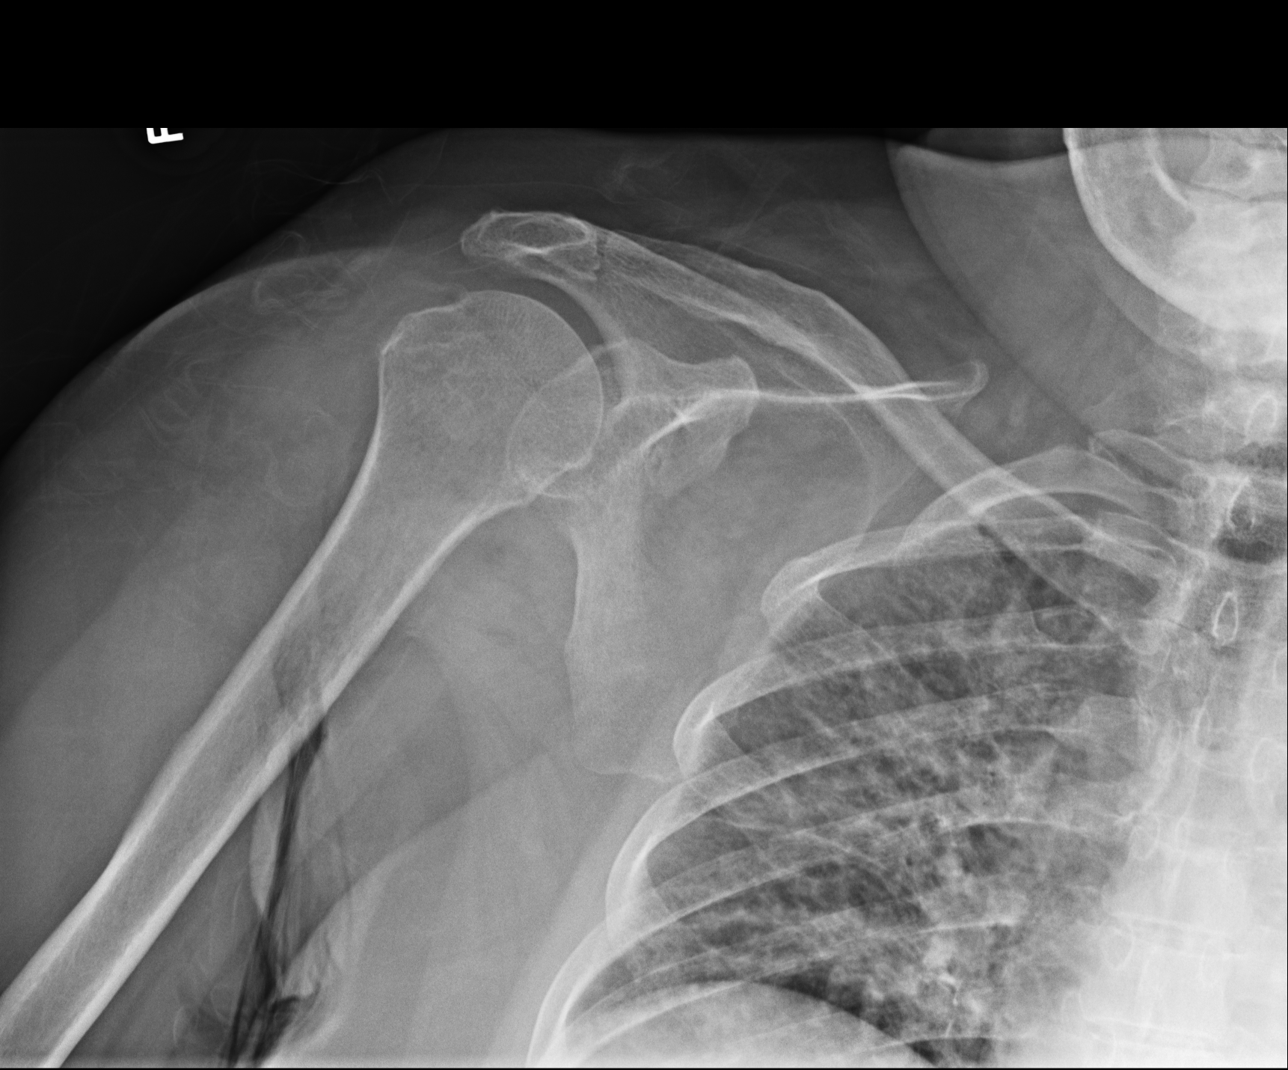
[im 3/4]
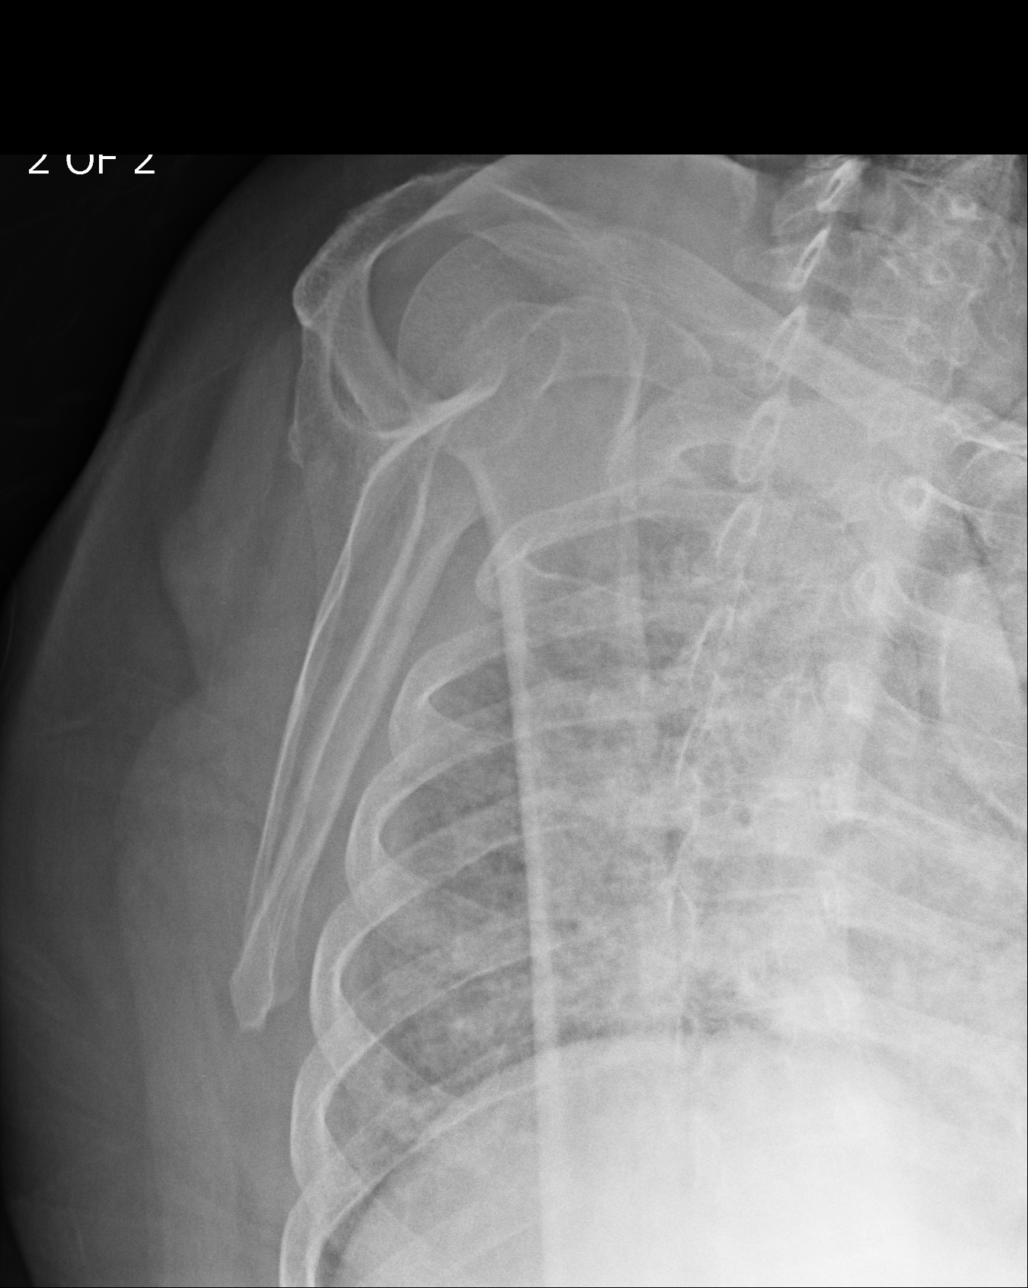
[im 4/4]
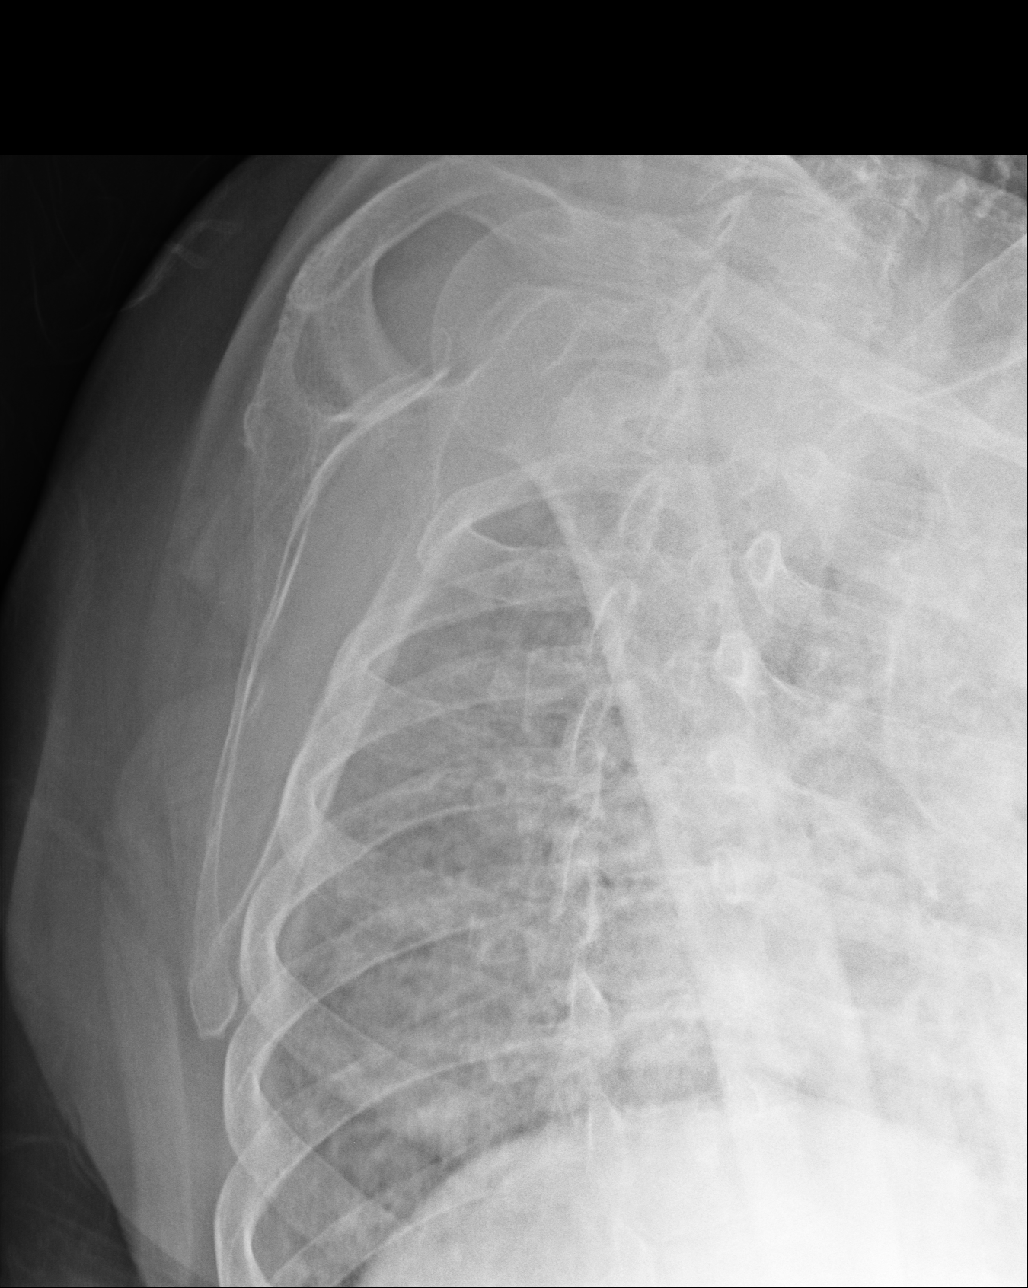

[4 of 4 positions shown; findings below may reference images not displayed]

PROCEDURE:     KDR - KDXR SHOULDER RIGHT COMPLETE  - October 06, 2012 [DATE]

RESULT:     Right shoulder images are compared to previous studies of
09/07/1912 and 06/19/1912. The right humeral head appears to be located in the
glenoid and appears intact. There is no definite fracture, dislocation or
radiopaque foreign body.
IMPRESSION: No acute bony abnormality evident. Given the persistent
shoulder pain with 3 radiographic studies of the right shoulder this year it
may be beneficial to consider followup with MRI of the right shoulder if
there is concern for occult fracture or ligamentous or tendinous abnormality.

[REDACTED]

## 2014-09-13 NOTE — Discharge Summary (Signed)
PATIENT NAME:  Kaitlyn Odom, Kaitlyn Odom MR#:  [id number] DATE OF BIRTH:  03/21/1931  DATE OF ADMISSION:  06/29/2012 DATE OF DISCHARGE:  07/03/2012  PRIMARY CARE PHYSICIAN: Julieanne Mansonichard Gilbert, MD  PRIMARY NEUROLOGIST: Cristopher PeruHemang Shah, MD  HISTORY OF PRESENT ILLNESS: This is a 81 year old Caucasian female with past medical history significant for hypertension, hypothyroidism, coronary artery disease status post stent, depression, anxiety and possible seizure disorder brought in from home secondary to worsening of her bleeding for the past couple of days. She had trouble breathing going on for several weeks, but just got worse for a few days before presentation. Denied any chest pain or cough and there was no fever or upper respiratory infection symptoms. She had chronic lower extremity edema for venous insufficiency and feels like her edema has been worsening for the past several days. She was supposed to take 80 mg of torsemide every day, but has been taking only 60 mg because of increased frequency of urination and she does not have any known history of COPD. She was a smoker and continued 1 to 2 cigarettes every day, at the time of presentation. She had obstructive sleep apnea and was on 2 liters home oxygen. She was hypoxic when brought in to hospital and was 80% saturating on room air, which came up to 92% on 3 liters, so she was admitted with acute hypoxic respiratory failure.   HOSPITAL COURSE AND STAY: Hypoxic respiratory failure. Initially it was thought that all these symptoms are because of congestive heart failure and also there was a possibility of having pulmonary edema. Chest x-ray showed mild edema. Her BNP was also elevated. She had bilateral lower extremity edema also. Her torsemide was changed to IV Lasix twice daily on admission as pulmonary embolism was high possibility so she was ordered with d-dimer, V/Q scan and bilateral lower extremity Doppler studies and placed on Lovenox sub-Q b.i.d. therapeutic  dose empirically. On admission BNP was 525. Troponins all 3 remained negative, less than 0.02. D-dimer was 0.81. Doppler studies showing no evidence of DVT. V/Q scan was done and it showed normal ventilation perfusion lung scan. CT of the chest without contrast was done. It showed bilateral diffuse interstitial thickening that can be seen with interstitial pneumonitis secondary to an infectious or inflammatory etiology versus mild interstitial edema and coronary artery disease. Chest x-ray, portable, on admission, showed there is mild limitation due to hypoinflation, but cannot exclude low-grade CHF. Echocardiogram was done and it showed no thrombus, left ventricular systolic function normal, ejection fraction 55%, normal left ventricular wall thickness, left ventricular wall motion is normal, right ventricular systolic function is normal, mild to moderate tricuspid regurgitation and right ventricular systolic pressure is normal. So she was treated in hospital with diastolic CHF exacerbation with IV Lasix. She had good improvement in her symptoms the next day and then her renal function was worsening so her Lasix was switched to oral and tapered the dose. As PE ruled out by V/Q scan and Doppler study of lower extremity we stopped anticoagulation treatment. She responded very nicely to the treatment and actually the last 2 days of hospital stay she was on room air and oxygen saturation was remaining more than 94%. So on discharge she does not need any oxygen supplementation.   OTHER MEDICAL ISSUES ADDRESSED DURING THE HOSPITAL STAY:  1. Acute renal failure due to dehydration. On admission creatinine was 1.14 and BUN was 23, which went up to 26 BUN and creatinine 1.42 and so we switched IV  Lasix to oral and decreased the dose and her symptoms remained fine so we are discharging with this dose of Lasix and we recommend to follow her renal function as outpatient. If it is worsening or does not recover, then she needs  to be followed by nephrology in the future in clinic.  2. Coronary artery disease. She had a history of coronary artery disease. She remained stable over hospital stay and we continued her home medications and discharging on the same.  3. Questionable history of seizure disorder. She was following with Dr. Sherryll Burger, neurology clinic. EEG was done 2 weeks ago and she was on Keppra 500 mg oral b.i.d. She also complained while in the hospital that she has some problems of her limbs, sometimes they are not following her thoughts of moving them.  For this complaint, we consulted neurologist and he suggested to continue followup with outpatient neurology clinic, Dr. Sherryll Burger, and continue the same medication, no further workup while she is in the hospital, and so we recommending the same.  4. Hypothyroidism. TSH level was around 1, which was in range, so we continued her home dose of levothyroxine and discharging on the same.  5. Increasing weakness for the last few weeks. She felt a little more stronger after treatment of her CHF and continues feeling more and more stronger. We are recommending rehab on discharge with physical therapy assistance.   IMPORTANT LAB RESULTS DURING HOSPITAL STAY: As mentioned, creatinine on admission 1.14 and on discharge 1.44. Troponin remained negative, less than 0.02. Hemoglobin 13.0. D-dimer 0.81. TSH 1.07. V/Q scan, Doppler study, echocardiogram and CT of chest as mentioned above.   CONSULTS IN HOSPITAL: Cardiology consult with Dr. Juliann Pares. Suggested to continue the same therapy and follow up with pulmonary clinic for obstructive sleep apnea; neurology consult with Dr. Loretha Brasil. Suggested to follow up with Dr. Sherryll Burger, in neurology clinic, and continue  same therapy for seizures.   FINAL DIAGNOSES ON DISCHARGE: 1. Diastolic heart failure.  2. Hypothyroidism.  3. Acute renal failure secondary to diuretics. 4. Weakness. 5. Hypertension. 6. Coronary artery disease. 7. Seizure  disorder.   CODE STATUS ON DISCHARGE: FULL CODE.   CONDITION ON DISCHARGE: Stable.   MEDICATIONS ADVISED ON DISCHARGE:  1. Pregabalin 50 mg oral capsule once a day. 2. Bupropion 150 mg extended release every 24-hour orally. 3. Paroxetine 40 mg oral tablet once a day.  4. Clopidogrel 75 mg oral tablet once a day.  5. Ferrous sulfate 325 mg oral tablet once a day.  6. Levothyroxine 175 mcg oral tablet once a day.  7. Meloxicam 7.5 mg oral tablet every 12 hours.  8. Keppra 500 mg oral tablet every 12 hours.  9. Aspirin 81 mg once a day.  10. Potassium chloride 20 milliequivalents once a day.  11. Lasix 20 mg oral tablet once a day.  12. Pantoprazole 40 mg delayed-release once a day.   HOME HEALTH ON DISCHARGE: Yes. Physical therapy services.   HOME OXYGEN ON DISCHARGE: No.   DIET ON DISCHARGE: Low sodium, low fat, low cholesterol diet, regular consistency.   ACTIVITY LIMITATION: As tolerated.   TIME FRAME TO FOLLOWUP: In 1 to 2 weeks with Dr. Sherryll Burger, neurology clinic, and with primary care physician, Dr. Julieanne Manson, for followup renal function in 1 week. Discharge creatinine was 1.4. If it worsens, she might need to follow up with nephrology clinic. Also advised to follow up with pulmonary clinic for obstructive sleep apnea.   TOTAL TIME SPENT ON DISCHARGE:  45 minutes.  ____________________________ Hope Pigeon Elisabeth Pigeon, MD vgv:sb D: 07/03/2012 09:14:01 ET T: 07/03/2012 09:58:55 ET JOB#: [id number]  cc: Hope Pigeon. Elisabeth Pigeon, MD, <Dictator> Richard Odom. Sullivan Lone, MD Hemang K. Sherryll Burger, MD Altamese Dilling MD ELECTRONICALLY SIGNED 07/14/2012 18:05

## 2014-09-13 NOTE — H&P (Signed)
PATIENT NAME:  Kaitlyn Odom, Kaitlyn Odom MR#:  [id number] DATE OF BIRTH:  11-22-1930  DATE OF ADMISSION:  08/07/2012  CHIEF COMPLAINT: Shortness of breath.   HISTORY OF PRESENT ILLNESS: The patient is 81 year old Caucasian female brought into the ER with the chief complaint of shortness of breath. The patient has been experiencing shortness of breath associated with productive cough for the past 2 to 3 days. The patient is reporting that she spiked a low-grade temperature this morning. The patient presented to the ER for shortness of breath with cough.  Denies any chest pain or abdominal pain. Denies any nausea or vomiting. The patient's chest x-ray has revealed pneumonia and pulmonary edema. The patient denies any chest pain or abdominal pain. Started feeling better after giving Solu-Medrol and breathing treatments. Denies any other complaints.   PAST MEDICAL HISTORY: Hypertension, coronary artery disease, history of CVA without any residual defect, coronary artery disease status post stenting, obstructive sleep apnea on 2 liters of oxygen, macular degeneration, hypertension, hypothyroidism and anxiety.   PAST SURGICAL HISTORY: Cholecystectomy, hysterectomy, carpal tunnel release, thyroidectomy, back surgery, status post vein procedures x 2.   ALLERGIES: No known drug allergies.  HOME MEDICATIONS:  1.  Torsemide 20 mg tablets 2 tablets once a day. 2.  Senna Plus 1 tablet p.o. 2 times a day. 3.  Pregabalin 1 capsule p.o. once a day. 4.  Potassium chloride 20 milliequivalents 1 tablet p.o. once daily. 5.  Paroxetine 40 mg 1 tablet once daily. 6.  Advair 250 mcg 1 puff inhalation 2 times a day.  7.  The patient was started on amoxicillin 500 mg 1 tablet p.o. 3 times a day by her primary care physician for cough. 8.  Keppra 500 mg 1 tablet p.o. q. 12 hours.  9.  Guaifenesin 200 mg/5 mL 10 mL p.o. as needed. 10.  Lasix 20 mg 1 tablet once daily.   PSYCHOSOCIAL HISTORY: Currently she is in a rehab center.  Otherwise, she lives with her daughter. Currently, the patient is not smoking. She denies any illicit drug usage.   FAMILY HISTORY: Hypertension and cancer runs in her family.  REVIEW OF SYSTEMS:  CONSTITUTIONAL:  Complaining of fever. Denies any weight loss or weight gain. Denies any fatigue.  EYES: Denies any blurry vision, inflammation or glaucoma.  EARS, NOSE AND THROAT: No hearing loss, snoring or postnasal drip. No difficulty in swallowing.  RESPIRATORY:  Complaining of productive cough, wheezing, shortness of breath and COPD.  Denies any asthma.  CARDIOVASCULAR: No chest pain or palpitations. GASTROENTEROLOGY: Denies nausea, vomiting, diarrhea or melena.  GENITOURINARY: No dysuria or hematuria.  ENDOCRINE:  No polyuria or nocturia. Positive chronic hypothyroidism.  HEMATOLOGIC/LYMPHATIC: No anemia, easy bruising or bleeding.  INTEGUMENTARY: No acne, rash or lesions.  MUSCULOSKELETAL: No joint pain in the neck or back or knee or hip. PSYCHIATRIC: No anxiety, insomnia or ADD.  PHYSICAL EXAMINATION:  VITAL SIGNS: Temperature 98.3, pulse 80, respirations 82, blood pressure 140/46, pulse ox 99%.  GENERAL APPEARANCE: Not in acute distress. Answers questions appropriately in full sentences. Moderately built and moderately nourished.  HEENT: Normocephalic, atraumatic. Pupils are equally reactive to light and accommodation. No conjunctival injection. No scleral icterus. Extraocular movements are intact. No postnasal drip. No sinus tenderness. Tympanic membranes are intact.  NECK: Supple. No JVD. No thyromegaly.  LYMPH:  No axillary lymphadenopathy.  LUNGS: Positive rales and rhonchi, more on the right side than on the left side. No accessory muscle usage.  RESPIRATORY:  No anterior chest wall  tenderness on palpation.  CARDIOVASCULAR: S1 AND S2 normal. Regular rate and rhythm. No murmurs.  ABDOMEN: Soft. Bowel sounds are positive in all 4 quadrants. Nontender, nondistended. No masses felt.   NEUROLOGIC: Awake and oriented x 3. Motor and sensory are grossly intact. Cranial nerves II through XII are grossly intact.  EXTREMITIES: No edema. No cyanosis. No clubbing.  SKIN: Warm to touch. No lesions and no acne noticed.  MUSCULOSKELETAL: No joint effusion. No tenderness. No erythema.  PSYCHIATRIC: Normal mood and affect.   LABORATORY AND IMAGING STUDIES: 12-lead EKG has revealed normal sinus rhythm with right bundle branch block and first degree AV block with prolonged PR interval of 204 ms.  Chest x-ray ha revealed some cephalization and developing right-sided infiltrate, on the right greater than the left, with some pulmonary edema.  Glucose 107. BNP 690. BUN 29, creatinine 1.46, potassium  4.2, chloride 105, CO2 28 and GFR is 34. Serum osmolality is 284 and calcium 8.0. WBC 7.9, hemoglobin 13.0, hematocrit 40.4 and platelet count 140,000.   ASSESSMENT AND PLAN: A 81 year old female presenting to the ER with the chief complaint of shortness of breath and cough associated with wheezing.  The patient is an ex-smoker. Denies any weight gain lately. 1.  Acute exacerbation of chronic obstructive pulmonary disease. Solu-Medrol 125 mg IV was given in the ER.  We will continue Solu-Medrol 60 mg IV every 6 hours, DuoNeb nebulizer treatments and Proventil nebulizer treatments. 2.  History of cardiovascular attack in the past. The patient will be on aspirin and Plavix, which are her home medications  3.  Chronic history of congestive heart failure. No fluid overload noticed at this time, but will continue her home medication, Lasix and potassium chloride supplements.  4.  Hypertension. Will resume home medications.  5.  Hypothyroidism. Continue Synthroid.   We will continue close monitoring of the renal function. As well the patient is on Lasix. We will hold off on Meloxicam and if necessary we will hold off on the Lasix if the renal function is getting worse. We will provide the patient with  gastrointestinal and deep vein thrombosis prophylaxis. The diagnosis and plan of care was discussed with the patient and her husband at bedside. They voiced understanding of the plan. Code status is DO NOT RESUSCITATE.  TOTAL TIME SPENT ON ADMISSION: 60 minutes. ____________________________ Ramonita LabAruna Daine Croker, MD ag:sb D: 08/07/2012 02:50:40 ET T: 08/07/2012 07:10:28 ET JOB#: [id number]  cc: Ramonita LabAruna Gwen Edler, MD, <Dictator> Richard L. Sullivan LoneGilbert, MD Ramonita LabARUNA Dalilah Curlin MD ELECTRONICALLY SIGNED 08/10/2012 3:18

## 2014-09-13 NOTE — Consult Note (Signed)
PATIENT NAME:  Kaitlyn Odom, Kaitlyn Odom MR#:  [id number] DATE OF BIRTH:  07/11/1930  DATE OF CONSULTATION:  06/30/2012  PRIMARY PHYSICIAN: Dr. Sullivan LoneGilbert  REFERRING PHYSICIAN:  Dr. Nemiah CommanderKalisetti  CONSULTING PHYSICIAN:  Brigido Mera D. [facility], MD  INDICATION: Congestive heart failure, leg edema, shortness of breath.    HPI:  The patient is a 81 year old white female with past history of hypertension, hypothyroidism, coronary artery disease status post stent, depression, anxiety, leg edema, shortness of breath, smoking, who states that she has been having trouble breathing for the last several days.  It just got worse in the past few days, finally come in to the emergency room for evaluation. She has complained of leg edema with venous insufficiency, feels like her edema has gotten worse in the past.   She was wearing support. She is supposed to be taking 80 of torsemide every day but has been taking 60 because of increased frequency of urination. She has some mild COPD and congestion, possible obstructive sleep apnea. She still smokes and has had obstructive sleep apnea on 2 liters at home because she does not like wearing her CPAP. She has had chronic hypoxemia and known coronary artery disease, so she was admitted for respiratory failure.   Review of systems: No blackout or syncope. No real nausea, vomiting. Denies fever, chills, sweats, or weight loss. She has had some weight gain. No hemoptysis or hematemesis. No bright red blood rectum. She has had shortness of breath, congestion, edema, weakness, fatigue, shortness of breath.   PAST MEDICAL HISTORY: Cerebrovascular accident with residual neurologic deficit; coronary artery disease; osteoarthritis; obstructive sleep apnea; hypoxemia; macular degeneration; hypertension; obesity; hypothyroidism; depression; absence seizures; possible gastritis with  gastrointestinal bleeding; legally blind in the left eye.   PAST SURGICAL HISTORY: Cholecystectomy, cataract surgery,  hysterectomy, thyroidectomy, carpal tunnel syndrome, venous procedure for lower extremity, percutaneous transluminal coronary angioplasty and stent.   ALLERGIES: None.   MEDICATIONS: Aspirin 81 mg a day; bupropion 150 mg daily; Plavix 75 a day;  Iron 325 mg once a day; Klor-Con 20 mEq a day; Keppra 500 twice a day; Lyrica 50 once a day; meloxicam 7.5 twice a day; Prilosec 20 mg twice a day; Protonix 40 mg a day; Synthroid 0.175 mg a day; torsemide 60 in the morning, should be on 80.     SOCIAL HISTORY: [facility].  Daughter recently bought her a wheelchair. She still smokes. No alcohol consumption.   FAMILY HISTORY: Dementia, stroke, prostate cancer.   PHYSICAL EXAMINATION:  VITAL SIGNS: Blood pressure 160/70, pulse 65, respiratory rate of 18, afebrile, sats 98% on 3 liters.  HEAD, EYES, EARS, NOSE, AND THROAT: Normocephalic, atraumatic. Pupils equal, reactive to light.  NECK: Supple. No significant jugular venous distention.  LUNGS: Bilateral rhonchi. Mild rales in the bases. No wheezing. Adequate air movement.  HEART: Distant. Regular rate and rhythm. Positive S4. Systolic ejection murmur left sternal border. PMI nondisplaced.  ABDOMEN: Benign. Positive bowel sounds. No rebound, guarding, or tenderness.  EXTREMITY: Normal ROM except 2 to 3+ edema. Adequate pulses.  NEUROLOGIC: Intact.  SKIN: Normal.   Laboratory, diagnostic, and radiological data: White count of 10.5, hemoglobin of 13.8, hematocrit 40.6, platelet count of 205, sodium 147, potassium 2.2, chloride of 114, BUN of 23, creatinine 1.14, glucose of 97. LFTs negative. Troponin less than 0.02. BNP 525. Chest x-ray: Mild interstitial edema. CK and MB are normal.   EKG: Normal sinus rhythm, rate of 64.   ASSESSMENT:  1.  Acute hypoxic respiratory failure.  2.  Obstructive sleep apnea.  3.  Chronic obstructive pulmonary disease.  4.  Hypoxemia.  5.  Leg edema.  6.  Coronary  artery disease.  7.  Hypothyroidism.  8.  Absence seizures.  9.  Obesity.  10.  Gastroesophageal reflux disease.   PLAN: Agree with admit for rule out for myocardial infarction. Follow up cardiac enzymes. Follow up EKG.  Continue telemetry. Continue Lasix therapy for heart failure. Follow up mild anemia. Follow up hypothyroidism. Consider treatment for obstructive sleep apnea. Continue anticoagulation for pulmonary edema. Chest x-ray for congestion. Echocardiogram will be helpful. We will consider VQ scan.  Coronary artery disease therapy.  Continue home medications. Seizure therapy should be continued. Have the patient follow up with Dr. Sherryll Burger.  Pulmonary input for obstructive sleep apnea, possible chronic obstructive pulmonary disease. I advised the patient to quit smoking. Evaluation for diabetes and treatment will continue current therapy.     ____________________________ Bobbie Stack. Juliann Pares, MD ddc:th D: 07/02/2012 21:53:12 ET T: 07/02/2012 22:50:02 ET JOB#: [id number]  cc: Zachariah Pavek D. Juliann Pares, MD, <Dictator> Alwyn Pea MD ELECTRONICALLY SIGNED 07/26/2012 12:11

## 2014-09-13 NOTE — Consult Note (Signed)
PATIENT NAME:  Sunny SchleinHOLTZ, Nirvana L MR#:  [id number] DATE OF BIRTH:  1931-04-09  DATE OF CONSULTATION:  07/02/2012  REFERRING PHYSICIAN:   CONSULTING PHYSICIAN:  Pauletta BrownsYuriy Analynn Daum, MD  REASON FOR NEUROLOGICAL CONSULTATION: Suspected history of seizure followup.   This is a pleasant 81 year old female with past medical history significant for hypertension, hypothyroidism, coronary artery disease, past medical history of stenting, depression, anxiety, possibility of questionable seizure disorder, that she follows up with Dr. Sherryll BurgerShah as an outpatient. She was brought in for secondary worsening of her breathing with an admission date of  06/29/2012. On admission, the patient complained of a several week history of shortness of breath and it was associated with worsening on exertion. The patient was admitted with chronic lower extremity edema and venous insufficiency. It was suspected that she was coming in with CHF exacerbation. Throughout her hospital course, her shortness of breath has improved. Her only complaint is generalized weakness. The reason for neurological consultation is questionable confusion episodes. The patient states that these episodes started about 3 years ago and recently have been more progressive. It is for that reason she started seeing Dr. Sherryll BurgerShah, who is her neurologist as an outpatient. Most of these episodes are associated with falls. The patient states she sometimes is in one place, she has a fall and then she realizes the time that she might of spent on the ground was 2 to 3 hours. She states she does not think she loses conscious, but she loses track of time during those episodes. There were no reported shaking episodes, no tonic-clonic activity, no tongue biting, no urinary incontinence and no suspected postictal state. Sometimes the patient describes these episodes as transient loss of memory. The patient states she could be walking in the store and the next thing she remembers when she was at  her care standing and not sure exactly how she got there. She remembers her bystanders asking her if she is okay and if she needs any assistance. The last one of these episodes was about 1 week ago and again, duration could be minutes, but mostly hours. The patient saw Dr. Sherryll BurgerShah. She is status post EEG as an outpatient and she was started on Keppra 500 mg. She states on Keppra, she is feeling much better. She thinks these episodes have been reduced in frequency.   PAST MEDICAL HISTORY: Includes suspected history of stroke, but she does not have any residual deficits. She has history of coronary disease, status post stenting, currently on aspirin and Plavix. She has history of osteoarthritis and obstructive sleep apnea, she is on 2 L oxygen. She is also a chronic smoker. She has hypertension, hypothyroidism, depression, history of GI bleed and she is legally blind in the left eye. She currently does not drive.   PAST SURGICAL HISTORY: Cholecystectomy, cataract surgery, hysterectomy, thyroidectomy and carpal tunnel release.  HOME MEDICATIONS: Have been reviewed that include aspirin, bupropion, Plavix, iron,  potassium supplementation, Keppra 500 mg twice a day, Lyrica for neuropathic pain at 50 mg daily, meloxicam, Prilosec, paroxetine, Synthroid and torsemide.   SOCIAL HISTORY: [facility] which [facility] an [facility] facility. She does not ambulate much and needs assistance from her daughter. She smokes occasionally, but creased from prior. She was smoking a pack per day.   FAMILY HISTORY: Mom has a history of dementia and a stroke.   REVIEW OF SYSTEMS:  CONSTITUTIONAL: Denies any fever, but is positive for generalized fatigue and weakness. HEENT: Eyes: She  has macular degeneration and cataracts and a legally blind left eye.  RESPIRATORY: She is currently not complaining of any shortness of breath.  CARDIAC: No chest pain.  GASTROINTESTINAL: No nausea. No vomiting.   GENITOURINARY: No dysuria or hematuria. No polyuria or nocturia.  HEMATOLOGY: No anemia easy bruising.  MUSCULOSKELETAL: Generalized weakness.  The patient's laboratory workup has been reviewed.   PHYSICAL EXAMINATION:  VITAL SIGNS: Temperature is 97.6, pulse 63, respirations 20, blood pressure 160/78. She is 94% on room air NEUROLOGIC: She is awake, alert, oriented to place, time location and the [employer] of the [place]. She able to tell me how many quarters are in a 1.50. Cranial nerve examination: Extraocular movements appeared to be intact. Pupils are 4 to 3 sluggish, but symmetrical bilaterally. Facial sensation and facial motor intact. Tongue is midline. Uvula elevates symmetrically. Shoulder shrug intact bilaterally. Motor: Appears to be 5- out of 5 bilateral upper extremities are 4- to 3+ out of  5. She has not been ambulating and there is evidence of edema in bilateral lower extremities. Reflexes are severely diminished specifically in her lower extremities. Coordination: Finger-to-nose was intact. Gait was not assessed.   IMPRESSION: This is a 81 year old female with multiple medical problems including hypertension, hypothyroidism, coronary artery disease, depression and anxiety, who comes in with symptoms of shortness of breath and generalized weakness. These symptoms have improved. She has also a questionable history of absent seizures. Her symptoms include transient confusion and sounds like even possible episodes of transient global amnesia where she does not realize where she is does not remember how she got to a certain place. A lot of these symptoms are associated with falls and apparently there are no loss of consciousness, no shaking, no tongue biting, no urinary incontinence and no postictal state. I am not convinced these are seizure episodes, specifically not absent seizures. Absent seizures happen usually in young kids. They last seconds and in this patient she states they  last minutes usually 30 to 40 minutes and up to hours at a time, which is not consistent with absent seizures. It is possible she might be having partial seizures, but there is no specific automatism that was described in terms of repetitive action and one side of the body compared to the other. She is on Keppra 500 mg twice a day. She states that she feels better with it on at this point.   PLAN: I would not change her antiepileptic medication. Continue the Keppra 500 mg b.i.d. She has established a relation with Dr. Sherryll Burger, who did her EEG 2 weeks ago. Would make another appointment for a future time. Again, if this was truly absent seizure,  the duration would be a lot shorter, it is usually seen in kids and treated differently, not with Keppra but usually treated with ethosuximide in kids or in adults it can be treated with valproic acid. So again, at this time, will not change any of her medications. I do not think she needs an EEG prior to discharged as she is going to assisted living facility tomorrow. Please make sure she has physical therapy on board there as she has generalized weakness.  Thank you. It was a pleasure seeing this patient.     ____________________________ Pauletta Browns, MD yz:aw D: 07/02/2012 12:32:08 ET T: 07/02/2012 13:03:55 ET JOB#: [id number]  cc: Pauletta Browns, MD, <Dictator> Pauletta Browns MD ELECTRONICALLY SIGNED 07/02/2012 18:15

## 2014-09-13 NOTE — H&P (Signed)
PATIENT NAME:  Kaitlyn Odom, Kaitlyn Odom MR#:  [id number] DATE OF BIRTH:  [birth year]  DATE OF ADMISSION:  09/07/2012  PRIMARY CARE PHYSICIAN: Dr. Julieanne Manson   CHIEF COMPLAINT: Syncope.   HISTORY OF PRESENT ILLNESS: This is a 81 year old female who was brought to the Emergency Room from independent living at [facility] due to a syncopal episode. The patient cannot recall the events herself. As per the daughter at bedside, the patient was found on the floor by her physical therapist who came to work with her this morning around noon. The patient does remember getting up this morning, fixing herself some breakfast, also getting dressed, but does not recall the events of when she was found down on the floor by the physical therapist. She was, therefore, sent over to the ER for further evaluation. In the Emergency Room, the patient had an extensive work-up, including CT head and basic metabolic work-up, which was essentially normal. She was noted to be slightly hypotensive at one point here in the ER with systolic blood pressures in the 70s. Otherwise, she has had no complaints. Due to her syncope and collapse, hospitalist services were contacted for further treatment and evaluation.   REVIEW OF SYSTEMS:   CONSTITUTIONAL: No documented fever. No weight gain, no weight loss.  EYES: No blurred or double vision.  ENT: No tinnitus. No postnasal drip. No redness of the oropharynx.  RESPIRATORY: No cough, no wheeze, no hemoptysis or dyspnea.  CARDIOVASCULAR: No chest pain, no orthopnea, no palpitations. Positive syncope.  GASTROINTESTINAL: No nausea, no vomiting, no diarrhea, no abdominal pain, no melena or hematochezia.  GENITOURINARY: No dysuria, no hematuria.  ENDOCRINE: No polyuria or nocturia, heat or cold intolerance.  HEMATOLOGIC: No anemia, no bruising, no bleeding.  INTEGUMENT: No rashes. No lesions.  MUSCULOSKELETAL: No arthritis, no swelling and no gout.  NEUROLOGIC: No numbness or tingling. No  ataxia. No seizure-type activity.  PSYCHIATRIC: Positive anxiety. No insomnia. No ADD. Positive depression.    PAST MEDICAL HISTORY: Consistent with: 1. Hypertension.  2. Hypothyroidism.  3. History of seizures.  4. COPD.  5. Anxiety/depression.  6. History of coronary artery disease, status post stent placement.  7. History of neuropathy.  8. GERD.   ALLERGIES: No known drug allergies.   SOCIAL HISTORY: No smoking. No alcohol abuse. No illicit drug abuse. Currently resides at [facility] independent living.   FAMILY HISTORY: The patient's mother died from complications of a stroke. Father died from complications of prostate cancer.   CURRENT MEDICATIONS: As follows: Advair 250/50 1 puff b.i.d., aspirin 81 mg daily, bupropion 150 mg daily, Plavix 75 mg daily, Combivent 1 puff q.i.d., iron sulfate 325 mg daily, Lasix 20 mg daily, guaifenesin 10 mL as needed, Keppra 500 mg b.i.d., Synthroid 175 mcg daily, Protonix 40 mg daily, Paxil 40 mg daily, potassium 20 mEq daily, pregabalin 150 mg at bedtime, Senokot 1 tab b.i.d.   PHYSICAL EXAMINATION:  VITAL SIGNS: On admission, temperature is 97.6, pulse 50, respirations 14, blood pressure 127/60, sats 95% on 2 liters nasal cannula.  GENERAL: She is a lethargic-appearing female but in no apparent distress.  HEENT: Atraumatic, normocephalic. Extraocular muscles are intact. Pupils are equal and reactive to light. Sclerae are anicteric. No conjunctival injection. No pharyngeal erythema.  NECK: Supple. There is no jugular venous distention, no bruits, no lymphadenopathy, no thyromegaly.  HEART: Regular rate and rhythm. A 2/6 systolic ejection murmur heard at the right sternal border. No rubs, no clicks.  LUNGS: Clear to auscultation bilaterally.  No rales, no rhonchi, no wheezes.  ABDOMEN: Soft, flat, nontender, nondistended. Has good bowel sounds. No hepatosplenomegaly appreciated.   EXTREMITIES: No evidence of any cyanosis, clubbing, or peripheral  edema. Has +2 pedal and radial pulses bilaterally.  NEUROLOGICAL: The patient is alert, awake, oriented x 3 with no focal motor or sensory deficits appreciated bilaterally.  SKIN: Moist and warm with no rash appreciated.  LYMPHATIC: There is no cervical or axillary lymphadenopathy.   LABORATORY AND RADIOLOGICAL DATA:  Serum glucose 100, BUN 29, creatinine 1.3, sodium 140, potassium five, chloride 105, bicarbonate 31, albumin 3.1 otherwise LFTs within normal limits. Troponin less than 0.02. White cell count 7.2, hemoglobin 12, hematocrit 36.2, platelet count 187. Urinalysis is within normal limits.   The patient had an x-ray of the right shoulder which showed no acute abnormalities and also an x-ray of the right humerus showing no acute osseous abnormality. The patient also had a CT of the head done without contrast which showed no evidence of any acute intracranial process but chronic small vessel ischemic disease.   ASSESSMENT AND PLAN: This is a 81 year old female, history of hypertension, hypothyroidism, chronic obstructive pulmonary disease, history of coronary artery disease status post stent placement, history of seizures and anxiety, presents to the hospital due to a syncopal episode as the patient was found down on the floor at her independent living by the physical therapist, and the patient cannot recall the events.   1. Syncope with collapse: The exact etiology of this is unclear whether it is vasovagal versus possibly cardiogenic syncope, unlikely this is neurogenic syncope. The patient has a nonfocal neurological exam. CT head is negative, and there was no evidence of any seizure-type activity, and the patient is already taking antiepileptics. I will observe the patient on telemetry, watch for any arrhythmias, the patient had a recent echocardiogram done in February of this year which showed normal LV function; therefore, I will not repeat, and  I will go ahead and get a carotid duplex. I  will check orthostatics and follow. I will get a physical therapy consult to assess her mobility in the morning. As per the daughter, the patient is actually scheduled to move to assisted living at [facility] later this month.  2. Hypothyroidism:  I will continue with the patient's Synthroid.  3. Chronic obstructive pulmonary disease: No evidence of acute exacerbation. Continue with Advair and Combivent.  4. History of coronary artery disease: No chest pain. First set of cardiac markers are negative. Continue aspirin, Plavix.  The patient is bradycardic, therefore, not on any beta blockers.  5. Anxiety and depression:  Continue bupropion and Paxil.  6. Gastroesophageal reflux disease:  Continue Protonix.  7. History of seizures: Continue with Keppra.   CODE STATUS: The patient is a DNI/DNR.   TIME SPENT: 50 minutes.   ____________________________ Rolly PancakeVivek J. Cherlynn KaiserSainani, MD vjs:cb D: 09/07/2012 19:54:59 ET T: 09/07/2012 21:13:26 ET JOB#: [id number]  cc: Rolly PancakeVivek J. Cherlynn KaiserSainani, MD, <Dictator> Houston SirenVIVEK J Taran Hable MD ELECTRONICALLY SIGNED 09/07/2012 21:44

## 2014-09-13 NOTE — Discharge Summary (Signed)
PATIENT NAME:  Kaitlyn SchleinHOLTZ, Claribel L MR#:  [id number] DATE OF BIRTH:  [birth year]  NO DICTATION    ____________________________ Hope PigeonVaibhavkumar G. Elisabeth PigeonVachhani, MD vgv:aw D: 07/03/2012 09:14:10 ET T: 07/03/2012 09:18:56 ET JOB#: [id number]  cc: Hope PigeonVaibhavkumar G. Elisabeth PigeonVachhani, MD, <Dictator>

## 2014-09-13 NOTE — H&P (Signed)
PATIENT NAME:  Kaitlyn Odom, KRUGER MR#:  [id number] DATE OF BIRTH:  11/21/1930  DATE OF ADMISSION:  06/29/2012  ADMITTING PHYSICIAN: Enid Baas, MD  PRIMARY CARE PHYSICIAN: Richard L. Sullivan Lone, MD   PRIMARY CARDIOLOGIST: Dwayne D. Callwood, MD  CHIEF COMPLAINT: Difficulty breathing.   HISTORY OF PRESENT ILLNESS: The patient is a 81 year old Caucasian female with past medical history significant for hypertension, hypothyroidism, coronary artery disease, status post stent, depression, anxiety and possible seizure disorder, was brought in from home secondary to worsening of her breathing for the past couple of days. The patient states her trouble breathing has been going on for several weeks now but just got worse in the past couple of days. She denies any chest pain or cough. No fevers or upper respiratory tract infection symptoms. She has chronic lower extremity edema from venous insufficiency and feels like her edema has been worse for the past several days. She is supposed to take 80 mg of torsemide every day, but has been taking only 60 mg because of increased frequency of urination. She does not have any known history of COPD. She was a smoker and still continues to smoke 1 or 2 cigarettes every day. She does have obstructive sleep apnea and is on 2 liters home oxygen. She was hypoxic when she was brought to the hospital with sats 80% on room air and satting now more than 92% on 3 liters. She is being admitted for acute hypoxic respiratory failure.   PAST MEDICAL HISTORY:  1.  History of CVA without any residual neurological deficits.  2.  History of coronary artery disease, status post stenting.  3.  Osteoarthritis.  4.  Obstructive sleep apnea on 2 liters nocturnal oxygen.  5.  Macular degeneration.  6.  Hypertension.  7.  Hypothyroidism.  8.  Depression and anxiety.  9.  Possible absence seizures.  10.  History of GI bleed from gastritis.  11.  Legally blind in left eye.   PAST  SURGICAL HISTORY:  1.  Cholecystectomy.  2.  Cataract surgery.  3.  Hysterectomy.  4.  Thyroidectomy.  5.  Carpal tunnel release.  6.  Venous procedure for lower extremity edema done by Dr. Earnestine Leys in the past.  ALLERGIES TO MEDICATIONS: No known drug allergies.   CURRENT HOME MEDICATIONS:  1.  Aspirin 81 mg p.o. daily.  2.  Bupropion 150 mg p.o. daily.  3.  Plavix 75 mg p.o. daily.  4.  Ferrous sulfate 325 mg 1 tablet once a day.  5.  Klor-Con 20 mEq p.o. daily.  6.  Keppra 500 mg p.o. b.i.d.  7.  Lyrica 50 mg p.o. daily.  8.  Meloxicam 7.5 mg p.o. b.i.d.  9.  Prilosec 20 mg p.o. b.i.d.  10.  Paroxetine 40 mg p.o. daily.  11.  Synthroid 175 mcg p.o. daily.  12.  Torsemide 60 mg in the morning.   SOCIAL HISTORY: Lives at home at [facility] independent living facility, mostly bedbound and daughter recently brought her a wheelchair from some known people for her to get around. Has a pack of cigarettes and still has a few cigarettes left over that she has been smoking for the past few months. No alcohol use.   FAMILY HISTORY: Mom with dementia and stroke. Dad had prostate cancer.  REVIEW OF SYSTEMS:  CONSTITUTIONAL: No fever. Positive for fatigue and weakness.  EYES: Blurred vision, macular degeneration and cataracts present. Legally blind in left eye.  ENT: No tinnitus, ear pain. No  hearing loss. No epistaxis or discharge.  RESPIRATORY: No cough, wheeze or hemoptysis. Positive for difficulty breathing. CARDIOVASCULAR: No chest pain, orthopnea, edema, arrhythmia, palpitations or syncope.  GASTROINTESTINAL: No nausea, vomiting, diarrhea, abdominal pain, hematemesis or melena.  GENITOURINARY: No dysuria, hematuria, increased frequency of urination.  ENDOCRINE: No polyuria, nocturia, thyroid problems, heat or cold intolerance.  HEMATOLOGY: No anemia, easy bruising or bleeding.  SKIN: No acne, rash or lesions.  MUSCULOSKELETAL: Positive for back pain and arthritis.  NEUROLOGIC:  History of CVA and absence seizures.  PSYCHOLOGICAL: No anxiety, insomnia, depression.   PHYSICAL EXAMINATION:  VITAL SIGNS: Temperature 97.7 degrees Fahrenheit, pulse 63, respirations 18, blood pressure 157/66, pulse oximetry 98% on 3 liters.  GENERAL: Heavily built, well nourished female lying in bed, not in any acute distress.  HEENT: Normocephalic, atraumatic. Pupils equal, round, reacting to light. Anicteric sclerae. Extraocular movements intact. Oropharynx clear without erythema, mass or exudates. Dry mucous membranes are present.  NECK: Supple. No thyromegaly, JVD or carotid bruits. No lymphadenopathy.  LUNGS: She is moving air bilaterally. Fine bibasilar crackles are present. No wheeze, but she is using accessory muscles to breathe, especially on exertion.  CARDIOVASCULAR: S1, S2, regular rate and rhythm and III/VI systolic murmur. No rubs or gallops.  ABDOMEN: Obese, soft, nontender, nondistended. No hepatosplenomegaly.  EXTREMITIES: She does have 4+ edema of bilateral lower extremities with some erythema of the skin and also tenderness. Unable to palpate dorsalis pedis pulses bilaterally. LYMPHATIC: No cervical lymphadenopathy.  NEUROLOGIC: Cranial nerves intact. No focal motor or sensory deficits. PSYCHOLOGICAL: The patient is awake, alert, oriented x 3.   LABORATORY, DIAGNOSTIC AND RADIOLOGICAL DATA: WBC 10.5, hemoglobin 13.8, hematocrit 40.6, platelet count 205. Sodium 147, potassium 3.2, chloride 114, bicarbonate 24, BUN 23, creatinine 1.14, glucose 97 and calcium 6.4. ALT 29, AST 25, alkaline phosphatase 113, total bilirubin 0.2, albumin 3.8. Troponin less than 0.02. BNP 525. Chest x-ray showing mild interstitial edema. CK and CK-MB within normal limits. EKG is showing normal sinus rhythm, heart rate of 64.   ASSESSMENT AND PLAN: A 81 year old female with history of hypertension, coronary artery disease, anxiety, depression, chronic lower extremity edema, being admitted for hypoxia.   1.  Acute hypoxic respiratory failure: Could be congestive heart failure exacerbation, and pulmonary embolism also cannot be ruled out. Chest x-ray only shows mild edema. She does have bilateral lower extremity edema which is chronic. Will change her torsemide to intravenous Lasix twice daily. Her last year echo done showed ejection fraction of greater than 55%. Will repeat the echo. Cardiology consult by Dr. Juliann Paresallwood. Because of her immobile status, we cannot rule out pulmonary embolism, so will get Dopplers of lower extremities. D-dimer and V/Q scan ordered. Place on Lovenox b.i.d. empirically because she appears extremely dyspneic on exertion. oxygen support as needed.  2.  Coronary artery disease: Appears stable at this time. Continue home medication. 3.  Hypothyroidism: Continue Synthroid.  4.  Absence seizures: Follows up with Dr. Sherryll BurgerShah. Continue Keppra. 5.  Gastrointestinal and deep vein thrombosis prophylaxis with Prilosec and Lovenox.   CODE STATUS: Full code.   TIME SPENT ON ADMISSION: 50 minutes.   ____________________________ Enid Baasadhika Pegah Segel, MD rk:jm D: 06/29/2012 20:56:57 ET T: 06/29/2012 22:03:07 ET JOB#: [id number]  cc: Enid Baasadhika Lu Paradise, MD, <Dictator> Dwayne D. Juliann Paresallwood, MD Richard L. Sullivan LoneGilbert, MD Enid BaasADHIKA Kaan Tosh MD ELECTRONICALLY SIGNED 07/11/2012 22:27

## 2014-09-13 NOTE — Discharge Summary (Signed)
PATIENT NAME:  Kaitlyn Odom, Kaitlyn L MR#:  [id number] DATE OF BIRTH:  March 05, 1931  DATE OF ADMISSION:  08/07/2012 DATE OF DISCHARGE:  08/11/2012  REASON FOR ADMISSION: Shortness of breath.   DISCHARGE DIAGNOSES: 1.  Acute chronic obstructive pulmonary disease exacerbation. 2.  Acute hypoxic respiratory failure due to chronic obstructive pulmonary disease. 3.  Acute renal failure. 4.  Coronary artery disease. 5.  Absence seizures. 6.  Hypothyroidism. 7.  Paroxysmal atrial fibrillation.   DISPOSITION: Home with daughter and the patient is looking to get into an assisted living facility, but there are still some issues to arrange (like esealling her house.)  DISCHARGE MEDICATIONS:  1.  Paroxetine 40 mg once a day. 2.  Keppra 500 mg every 12 hours. 3.  Lyrica 50 mg once a day. 4.  Ibuprofen 150 mg extended-release. 5.  Aspirin 81 mg once a day. 6.  Plavix 75 mg once a day. 7.  Furosemide 20 mg once a day. 8.  Ferrous sulfate 325 mg once a day. 9.  Potassium chloride 20 milliequivalents extended release once a day. 10.  Pantoprazole 40 mg once a day. 11.  Levothyroxine 175 mcg once a day. 12.  Senna Plus 50/8.6 twice daily.  13.  Guaifenesin 200 mg/5 mL take 10 mL as needed for cough. 14.  Advair Diskus 250/50 twice daily. 15.  Prednisone taper decrease x 10 mg every 2 days until gone. 16.  Cardizem 120 mg take once daily. 17.  Levofloxacin 250 mg once a day. 18.  Combivent 20/100 mg 4 times a day as needed for shortness of breath.   DISCHARGE FOLLOWUP:  With Dr. Adrian BlackwaterShaukat Khan for atrial fibrillation as well as Dr. Julieanne Mansonichard Gilbert as  primary care.  HOSPITAL COURSE;  Kaitlyn Odom is a very nice 81 year old female who has history of COPD.   She was admitted on 08/07/2012.  Please refer to H and P by Dr. Amado CoeGouru for more details. She came to the ER with complaints of shortness of breath, which has been producing some cough and sputum for 2 to 3 days prior to this admission. The patient had some  increase in her temperature, low-grade temperature in the morning, and was not feeling very well for what she came to the ER to be evaluated. Chest x-ray showed some pneumonia and pulmonary edema and the patient was admitted, given nebulizations and respiratory treatments. She was put on antibiotics, broad-spectrum, and overall she did well. She was able to be weaned off of her oxygen and improved significantly. The x-rays mostly show some increase of her vascular markings, but the patient was pretty much euvolemic for what did not require much of changes on her diuresis.   The patient had an episode of atrial fibrillation with RVR with heart rate of 130 for what she was put on Cardizem.  Her Cardizem was adjusted and since this was just a little flare continue on p.o. Cardizem.   Overall the patient did very well during this admission. She required several days to improve her respiratory status, but she was able to be taken off oxygen and discharged on room air.   As far as her medical problems:  1.  Acute hypoxic respiratory failure.  It was likely due to COPD exacerbation and pneumonia. The patient was started on Levaquin and steroids and her steroids were tapered very slowly. She had leukocytosis secondary to steroids. She was afebrile for most of her hospitalization and did not have any symptoms of sepsis.  2.  As far as her chronic diastolic dysfunction, her last echo showed an ejection fraction of 55% for what she was just put on Lasix p.o.  3.  She developed acute renal failure. That improved significantly after holding Lasix. Now she can go back on her oral agents.  4.  As far as her atrial fibrillation, the patient has p.o. Cardizem and she is going to be discharged on 120 mg once a day. 5.  Hypothyroidism.  Stable.    6.  Absence seizures.  The patient is to followup with Dr. Sherryll Burger, outpatient neurology, as she has been doing it for a while. Other than that, she did okay.  TIME SPENT:   About 35 minutes with this patient on the day of discharge.  ____________________________ Felipa Furnace, MD rsg:sb D: 08/15/2012 09:50:32 ET T: 08/15/2012 10:13:24 ET JOB#: [id number]  cc: Felipa Furnace, MD, <Dictator> Laurier Nancy, MD Richard L. Sullivan Lone, MD Regan Rakers Juanda Chance MD ELECTRONICALLY SIGNED 08/18/2012 13:34

## 2014-09-13 NOTE — Discharge Summary (Signed)
PATIENT NAME:  Kaitlyn Odom, Kaitlyn Odom MR#:  [id number] DATE OF BIRTH:  1931-04-26  DATE OF ADMISSION:  09/07/2012 DATE OF DISCHARGE:  09/08/2012  DISCHARGE DIAGNOSES: 1.  Syncope likely vasovagal. 2.  Mild dehydration.  3.  Depression and anxiety. 4.  Hypertension, hypothyroidism. 5.  Gastroesophageal reflux disease.  6.  Coronary artery disease with stent.  7.  Chronic obstructive pulmonary disease,  DISCHARGE MEDICATIONS:  Paxil 40 mg p.o. daily.  Keppra 500 mg p.o. q.12 hours.  Lyrica 50 mg p.o. daily.  Bupropion 150 mg p.o. daily.  Aspirin 81 mg daily. Plavix 75 mg p.o. daily. Lasix 20 mg daily.  Ferrous sulfate 325 mg p.o. daily.  KCl 20 mEq by mouth daily.  Toprol 40 mg p.o. daily.  Levothyroxine 175 mcg by mouth daily.  Senna 1 tablet p.o. b.i.d.  Advair Diskus 250/51 puff b.i.d.  Combivent 1 puff 4 times daily.   DIET: Low sodium diet.    DISPOSITION:  independant living facility and the family is planning to take her to [facility].  The patient followed with advanced home care.  The patient uses a rolling walker.   HOSPITAL COURSE: This patient is a 81 year old female patient brought in because of syncope.  The patient was found on the floor by physical therapist. The patient does not remember getting up in the morning, fixing her breakfast and being sent here for her syncope.  CT head did not show any acute changes. The patient was slightly hypotensive with systolic blood pressure of 70s in the ER. The patient was admitted for syncope.  Her vitals showed 95% sat on 2 liters. Blood pressure was 120/70 on further evaluation.  At the time of admission the patient was admitted to telemetry. She did not have any cardiac arrhythmias. The patient had an echo done in February which showed normal left ventricular function, so that was not repeated. The patient had a carotid ultrasound which showed negative of any hemodynamically significant stenosis.  She felt much better and did not  have any more dizziness and chest x-ray also did not show any acute changes.  It was thought that this could be secondary to vasovagal syncope with dehydration on hypotension. The patient is on high dose Lasix.  The daughter told me that the patient takes 60 mg of Lasix in the morning and then this syncope was found after she took Lasix so I told her that after taking the Lasix, the patient should be in the bed without much physical activity to help her with dizziness and possible syncope happening in the future and also advised her to drink enough water, at least 1-1/2 liters a day. The patient is known to have a poor p.o. intake in terms of fluid, so explained to the daughter that she can have some adequate hydration.  CODE STATUS: DO NOT RESUSCITATE DISCHARGE INSTRUCTIONS:   The patient was discharged back to independent living facility.  The daughter told her that they are planning to move her to [facility] facility probably in 10 days.   CONDITION ON DISCHARGE:  Stable. The patient's blood pressure 104/64 at the time of discharge.  Heart rate was around 62.   Time spent on discharge preparation: More than 30 minutes.     ____________________________ Katha HammingSnehalatha Clora Ohmer, MD sk:ct D: 09/09/2012 12:05:45 ET T: 09/09/2012 13:58:42 ET JOB#: [id number]  cc: Katha HammingSnehalatha Isidoro Santillana, MD, <Dictator> Katha HammingSNEHALATHA Dashay Giesler MD ELECTRONICALLY SIGNED 09/21/2012 22:22

## 2014-09-15 NOTE — Discharge Summary (Signed)
PATIENT NAME:  Kaitlyn Odom, Kaitlyn F MR#:  [id number] DATE OF BIRTH:  03-16-1931  DATE OF ADMISSION:  05/20/2011 DATE OF DISCHARGE:  05/26/2011   PRIMARY CARE PHYSICIAN: Julieanne Mansonichard Gilbert, MD    DISCHARGE DIAGNOSES:  1. Community-acquired pneumonia, improving on antibiotic.  2. Possible chronic obstructive pulmonary disease exacerbation, improving with steroid taper.   SECONDARY DIAGNOSES:  1. Cerebrovascular accident without any residual deficit.  2. Coronary artery disease, status post stenting. 3. Osteoarthritis.  4. Sleep apnea.  5. Macular degeneration.  6. Hypertension.  7. Hypothyroidism.  8. Anxiety.   CONSULTATION: Physical therapy    PROCEDURES/RADIOLOGY:  1. Chest x-ray on December 27th showed interstitial infiltrate likely representing pulmonary edema. Asymmetric edema versus focal infiltrate in the left lower lobe and right perihilar region.  2. Chest x-ray on January 1st showed essentially unchanged interstitial pulmonary opacities.   MAJOR LABORATORY PANEL: Influenza A and B were negative on December 27th. Sputum culture grew few WBCs, few epithelial cells, rare gram-positive cocci.   HISTORY AND SHORT HOSPITAL COURSE: The patient is a 81 year old female with above-mentioned medical problems who was admitted for community-acquired pneumonia. She was also found to have possible COPD exacerbation. She was started on Rocephin and Zithromax along with Solu-Medrol and she was slowly improving on current regimen. She was also evaluated by physical therapy who worked with her while in the hospital and recommended short-term rehab which she has agreed on and has a bed available at [facility] where she is being discharged in stable condition. She is not requiring any oxygen.   On the date of discharge her vital signs are as follows: Temperature 98.2, heart rate 62 per minute, respirations 20 per minute, blood pressure 135/71 mmHg. She is saturating 94% on room air.   PERTINENT PHYSICAL  EXAMINATION: CARDIOVASCULAR: S1, S2 normal. No murmurs, rubs, or gallop. LUNGS: Clear to auscultation bilaterally. No wheezing, rales, rhonchi, or crepitation. ABDOMEN: Soft, benign. NEUROLOGIC: Nonfocal examination. All other physical examination remained at baseline.   DISCHARGE MEDICATIONS:  1. Paxil 40 mg p.o. daily. 2. Accupril 81 mg p.o. daily.  3. Lisinopril 20 mg p.o. daily. 4. Metoprolol 25 mg half-tablet p.o. b.i.d.  5. Plavix 75 mg p.o. daily.  6. Lasix 80 mg p.o. daily.  7. Synthroid 200 mcg p.o. daily.  8. Wellbutrin 150 mg p.o. daily.  9. Klor-Con 20 mEq p.o. daily.  10. Lyrica 50 mg p.o. at bedtime.  11. Meloxicam 7.5 mg p.o. b.i.d.  12. Augmentin 875 mg p.o. b.i.d.  13. Advair 250/50 1 puff b.i.d.  14. Spiriva once daily.  15. Prednisone 60 mg p.o. daily, taper 10 mg daily until finished.   DISCHARGE DIET: Low sodium.   DISCHARGE ACTIVITY: As tolerated.   DISCHARGE INSTRUCTIONS AND FOLLOW-UP:  1. The patient was instructed to follow-up with her primary care physician, Dr. Julieanne Mansonichard Gilbert, in 1 to 2 weeks.  2. She will need follow-up with [facility], Dr. Kendrick FriesMcQuaid, in 2 to 3 weeks.  3. She was set up to get physical therapy evaluation and management while at rehab facility.   TOTAL TIME DISCHARGING THIS PATIENT: 55 minutes.   ____________________________ Ellamae SiaVipul S. Sherryll BurgerShah, MD vss:drc D: 05/26/2011 15:24:23 ET T: 05/26/2011 15:58:32 ET JOB#: [id number]  cc: Aliea Bobe S. Sherryll BurgerShah, MD, <Dictator> Richard L. Sullivan LoneGilbert, MD [facility], Dr. Jeananne RamaMcQuaid  Zaliah Wissner S [facility] MD ELECTRONICALLY SIGNED 05/26/2011 22:48

## 2014-09-15 NOTE — Consult Note (Signed)
PATIENT NAME:  Kaitlyn Odom, Kaitlyn Odom MR#:  [id number] DATE OF BIRTH:  1930/08/23  DATE OF CONSULTATION:  06/01/2011  REFERRING PHYSICIAN:  Dr. Cherlynn Kaiser  CONSULTING PHYSICIAN:  Jewel Venditto R. Sherrlyn Hock, MD  REASON FOR CONSULTATION: Persistent leukocytosis, no clear infectious source.   HISTORY OF PRESENT ILLNESS: Patient is a 81 year old female patient who has been admitted to hospital on 01/04 after she was transferred from [facility] with reports of black stools, nausea, vomiting, and guaiac-positive stools. Patient also was recently hospitalized the week before and was discharged on 01/02 after treatment for pneumonia and possible chronic obstructive pulmonary flare and was on steroid taper. She has been found to have gastrointestinal bleeding and had EGD done on 01/05 which showed granular mucosa and duodenal bulb, nonobstructing Schatzki ring, otherwise normal stomach. Patient is planned for colonoscopy tomorrow. Labs during this admission have shown significant leukocytosis, WBC count was 26,700 on 01/04 with 83% neutrophils, 14% lymphocytes, hemoglobin was 9.5, platelets 189. Blood cultures have remained negative. Patient is currently off of steroids, remains afebrile and no clear infectious source is found. WBC count has improved to 21.1K on 01/06, 18.2 on 01/07 and remained steady but still elevated at 18K today. Hemoglobin 9, platelet count 188. CBC on 12/27 showed only mild leukocytosis of 12,200, ANC 8800 and normal hemoglobin and platelets. Patient denies any known prior history of leukocytosis in the past. She had generalized weakness, feels better after blood transfusion. Denies any night sweats. No unintentional weight loss. Has chronic arthritis but no new bone pains.   PAST MEDICAL/SURGICAL HISTORY: 1. History of cerebrovascular accident without residual deficit. 2. Coronary artery disease status post stenting. 3. Osteoarthritis. 4. Obstructive sleep apnea.  5. Macular degeneration. 6. Recent  admission 12/27 to 01/02 for community-acquired pneumonia and chronic obstructive pulmonary disease exacerbation. 7. Macular degeneration. 8. Hypertension.  9. Hypothyroidism.  10. Anxiety. 11. Back surgery.  12. Cholecystectomy.  13. Hysterectomy.  14. Thyroidectomy. 15. Carpal tunnel release. 16. Status post vein procedure x2.   FAMILY HISTORY: Remarkable for hypertension and cancer. Denies hematological disorders.   SOCIAL HISTORY: Quit smoking recently. Denies alcohol usage. Currently was getting rehab at [facility].   ALLERGIES: No known drug allergies.   MEDICATIONS PRIOR TO ADMISSION: 1. Aspirin 81 mg daily.  2. Prednisone taper.  3. Paroxetine 40 mg daily.  4. Lisinopril 20 mg daily.  5. Metoprolol 25 mg b.i.d.  6. Plavix 75 mg daily.  7. Furosemide 80 mg daily.  8. Levothyroxine 200 mcg daily.  9. Klor-Con 20 mEq daily.  10. Lyrica 50 mg at bedtime.  11. Augmentin 875 mg b.i.d.  12. Bupropion XL 150 mg daily.  13. Ipratropium nebulizer q.6 hours p.r.n.   REVIEW OF SYSTEMS: CONSTITUTIONAL: As in history of present illness. Has been ambulating slowly up until recently. HEENT: No headaches, dizziness, epistaxis, ear or jaw pain. No new sinus symptoms. CARDIAC: Has dyspnea on exertion with chronic cough and wheezing. No palpitation, orthopnea, or paroxysmal nocturnal dyspnea. LUNGS: As in history of present illness. No hemoptysis. GASTROINTESTINAL: Denies nausea, vomiting, or diarrhea. No bright red blood in stools or melena. GENITOURINARY: No dysuria or hematuria. HEMATOLOGIC: Has easy skin bruising, denies other bleeding symptoms. EXTREMITIES: Has chronic swelling with some pain. Venous Doppler negative for deep vein thrombosis during this admission. NEUROLOGICAL: History of prior CVA. Denies any recent focal weakness, loss of consciousness or seizures. ENDOCRINE: No polyuria or polydipsia. Appetite is steady.   PHYSICAL EXAMINATION:  GENERAL: Patient is a moderately  built  obese individual resting in bed, alert and oriented x3 and converses appropriately. No acute distress.   VITAL SIGNS: Temperature 98, pulse 53, respiratory rate 20, blood pressure 134/73, 97% on room air.   HEENT: Normocephalic, atraumatic. Extraocular movements intact. Sclerae anicteric. No oral thrush.   NECK: Supple without lymphadenopathy.    CARDIOVASCULAR: S1 and S2, regular rate and rhythm.   LUNGS: Lungs show bilateral diminished breath sounds overall especially at bases, no rhonchi.   ABDOMEN: Soft. No hepatosplenomegaly clinically.   LYMPHATICS: No adenopathy palpable in the neck, axillary, or inguinal areas.   EXTREMITIES: Shows bilateral edema, no cyanosis.   NEUROLOGIC: Cranial nerves are intact. Moves all extremities spontaneously.   MUSCULOSKELETAL: No obvious joint deformity or swelling.   LABORATORY, DIAGNOSTIC, AND RADIOLOGICAL DATA: CBC today shows WBC 18,000, hemoglobin 9, MCV 91, platelet count 188, neutrophils 58%, creatinine 0.83, potassium 4.1, calcium 7.8. Stool C. difficile is negative. CT abdomen and pelvis does not report any obvious hepatosplenomegaly or lymphadenopathy   IMPRESSION AND RECOMMENDATIONS: [id number] year old female patient with history of multiple medical problems with recent hospitalization for pneumonia and chronic obstructive pulmonary disease exacerbation and has been taking antibiotics and prednisone taper. Found to have significant neutrophilic leukocytosis during this admission which has somewhat improved but has not normalized. Also admitted with worsening anemia and GI bleed and clinically beginning to feel better after transfusion. She is planned for colonoscopy tomorrow to look for bleeding source. Otherwise remains afebrile with no obvious source of infection. Hematology has been consulted for the leukocytosis. She is currently off of prednisone but given recent prednisone treatment most likely etiology for leukocytosis is reactive  secondary to ongoing acute issues along with recent prednisone therapy. Will also get further work-up to rule out underlying hematological disorders like chronic myeloid leukemia or chronic lymphocytic leukemia. Will check leukocyte alkaline phosphatase score, BCR-ABL study to look for evidence of CML, peripheral blood immunophenotyping study to rule out underlying lymphoproliferative disorder like CLL. Otherwise recommend continued monitoring of blood counts. Will follow up after results of above work-up is available and make further recommendations. Patient explained above, agreeable to this plan.   Thank you for the referral. Please feel free to contact me if any additional questions.  ____________________________ Maren ReamerSandeep R. Sherrlyn HockPandit, MD srp:cms D: 06/01/2011 23:21:31 ET T: 06/02/2011 05:48:30 ET JOB#: [id number]  cc: Keysha Damewood R. Sherrlyn HockPandit, MD, <Dictator> Wille CelesteSANDEEP R Lakysha Kossman MD ELECTRONICALLY SIGNED 06/02/2011 9:31

## 2014-09-15 NOTE — Consult Note (Signed)
PATIENT NAME:  Kaitlyn Odom, Kaitlyn Odom MR#:  [id number] DATE OF BIRTH:  December 21, 1930  DATE OF CONSULTATION:  05/28/2011  REFERRING PHYSICIAN:  Dr. Katharina Caper CONSULTING PHYSICIAN:  Lurline Del, MD  PRIMARY CARE PHYSICIAN:  Dr. Sullivan Lone  REASON FOR CONSULTATION: GI bleed.   HISTORY OF PRESENT ILLNESS: This is a 81 year old female with history of coronary artery disease status post coronary artery stenting. She has been on Plavix. History of sleep apnea, cerebrovascular accident, history of tobacco abuse, chronic obstructive pulmonary disease, hypothyroidism, and hypertension. The patient was recently discharged a couple of days ago after an admission for community-acquired pneumonia and chronic obstructive pulmonary disease exacerbation. Apparently at the rehab she has been feeling weak. Her stools have been dark and black and she was found to have dropping hemoglobin and hematocrit. The patient was transferred here. She was found to be significantly anemic. She is having a couple of bowel movements a day which have been reported as dark. She has recently vomited a few times and the vomitus was reported to be green and not dark or bloody. According to her, she has been noticing black stools on and off for almost a month. As mentioned above, she has been on aspirin and Plavix. The patient denies any significant abdominal pain. Overall, she feels poorly because of recent pneumonia and overall poor health.   PAST MEDICAL HISTORY:  1. History of community-acquired pneumonia recently.  2. History of cerebrovascular accident.  3. Coronary artery disease, status post stenting. 4. Osteoarthritis.  5. Obstructive sleep apnea.  6. Macular degeneration.  7. Hypertension.  8. Hypothyroidism.  9. Anxiety.   PAST SURGICAL HISTORY:  1. Prior back surgery.  2. Cholecystectomy.  3. Hysterectomy.  4. Carpal tunnel surgery.  5. Thyroidectomy.   ALLERGIES: None.   HOME MEDICATIONS:  1. Paroxetine. 2. Aspirin  81 mg a day. 3. Lisinopril 20 mg a day.  4. Metoprolol 25 mg b.i.d.  5. Plavix 75 mg a day. 6. Lasix 80 mg a day. 7. Levothyroxine 200 mcg a day.  8. Potassium chloride.  9. Lyrica. 10. Meloxicam.  11. Augmentin. 12. Prednisone. 13. BuSpar. 14. Budesonide. 15. Ipratropium nebulizer.   SOCIAL HISTORY: She used to be a smoker. She does not smoke anymore.   FAMILY HISTORY: Quite unremarkable.   REVIEW OF SYSTEMS: As above.   PHYSICAL EXAMINATION:  GENERAL: Elderly female, appears to be chronically ill, does not appear to be in any acute distress, but does appear to be quite weak and overall in poor health. The patient became hypotensive today and required a couple of boluses of 500 mL of saline after which her blood pressure responded fairly well. The patient also went into atrial fibrillation with rapid heart rate up to 160, although currently her heart rate has come down into the 80s and 90s. Most recent blood pressure reported to be 90/60, although prior blood pressure was 123/93.  Temperature 98.1. She appears somewhat pale.    NECK:  Neck veins are flat.   LUNGS: Bilateral crackles and few scattered wheezes.   CARDIOVASCULAR: Regular rate and rhythm. No gallops were heard. 2/6 systolic murmur was noted.   ABDOMEN: She has mild left upper quadrant tenderness. Abdominal examination is otherwise quite benign. Bowel sounds are positive. No rebound or guarding was noted.   NEUROLOGIC: She is somewhat lethargic but otherwise appears to be nonfocal.   LABS/STUDIES:  Hemoglobin was 9.9 on admission. It is still around 9.5, although the most recent hemoglobin that was done  after she became hypotensive is still pending. Troponin is normal at 0.03. Electrolytes are fairly unremarkable. PT and INR have not been done. BUN was 86 with a creatinine of 1.3. Urinalysis has not been done. White cell count is elevated to about 28,000 on admission. Venous Doppler ultrasound is negative for deep vein  thrombosis.   ASSESSMENT AND PLAN: The patient is with melena with significant drop in her hemoglobin and hematocrit as well as hypotension. The patient also has severe leukocytosis, which may be secondary to steroids, although sepsis is a consideration. The patient has been on nonsteroidals along with steroids and has been on antiplatelet agents, making an upper GI bleed fairly likely. I would recommend discontinuing Mobic as well as prednisone. Hold Plavix. I agree with IV Protonix drip. Follow hemoglobin and hematocrit closely and transfuse as needed. Once the patient becomes more stable we will proceed with an upper GI endoscopy with the help of anesthesia for further evaluation and management. Her being on Plavix until recently may complicate the endoscopic procedure and may make it less likely to be of any therapeutic value, although a diagnostic upper GI endoscopy can be safely be performed and will be performed probably tomorrow after stabilization and blood transfusion if needed. Meanwhile, I would recommend obtaining urinalysis and other studies such as blood cultures to rule out sepsis. We will follow her closely. The patient has multiple comorbid conditions and her prognosis is poor, and that has been discussed with the patient and family member.   ____________________________ Lurline DelShaukat Malkie Wille, MD si:bjt D: 05/28/2011 17:17:36 ET T: 05/29/2011 10:33:47 ET JOB#: [id number]  Lurline DelSHAUKAT Eligh Rybacki MD ELECTRONICALLY SIGNED 05/29/2011 14:36

## 2014-09-15 NOTE — Consult Note (Signed)
Chief Complaint:   Subjective/Chief Complaint Feels the same, weak. Denies any diarrhea.   VITAL SIGNS/ANCILLARY NOTES: **Vital Signs.:   06-Jan-13 10:00   Vital Signs Type Q 4hr   Temperature Temperature (F) 97.7   Celsius 36.5   Temperature Source axillary   Pulse Pulse 62   Pulse source per Dinamap   Respirations Respirations 20   Systolic BP Systolic BP 87   Diastolic BP (mmHg) Diastolic BP (mmHg) 52   Mean BP 63   BP Source Dinamap   Pulse Ox % Pulse Ox % 96   Pulse Ox Activity Level  At rest   Oxygen Delivery Room Air/ 21 %   Brief Assessment:   Additional Physical Exam Abdomen is somewhat distended but soft and benign. Appears very waek but in no distress.   Routine Hem:  06-Jan-13 04:38    WBC (CBC) 21.1   RBC (CBC) 2.61   Hemoglobin (CBC) 7.7   Hematocrit (CBC) 23.3   Platelet Count (CBC) 159   MCV 89   MCH 29.4   MCHC 32.9   RDW 14.7   Neutrophil % 76.4   Lymphocyte % 10.5   Monocyte % 7.5   Eosinophil % 4.7   Basophil % 0.9   Neutrophil # 16.2   Lymphocyte # 2.2   Monocyte # 1.6   Eosinophil # 1.0   Basophil # 0.2  Routine Chem:  06-Jan-13 04:38    Glucose, Serum 144   BUN 13   Creatinine (comp) 0.79   Sodium, Serum 142   Potassium, Serum 3.6   Chloride, Serum 106   CO2, Serum 28   Calcium (Total), Serum 7.4   Anion Gap 8   Osmolality (calc) 286   eGFR (African American) >60   eGFR (Non-African American) >60   Assessment/Plan:  Assessment/Plan:   Assessment Melena, resolved. EGD was unremarkable. Anemia with melena. H and H is lower without signs of active bleeding. ? small bowel or colonic source. Leucocytosis. History of recent pneumonia and antibiotic use. ? C. diff colitis.    Plan Recommend 1 unit PRBC transfusion and follow H and H. Add Falgyl to [employer]. Stool for C. diff toxin. CT abdomen.Patient will require colonoscopy +/- small bowel capsule endoscopy but at this time is too weak to undergo a colonoscopy. Plan discussed  with Dr. Verdell Carmine. Will follow.   Electronic Signatures: Jill Side (MD)  (Signed 06-Jan-13 11:15)  Authored: Chief Complaint, VITAL SIGNS/ANCILLARY NOTES, Brief Assessment, Lab Results, Assessment/Plan   Last Updated: 06-Jan-13 11:15 by Jill Side (MD)

## 2014-09-15 NOTE — H&P (Signed)
PATIENT NAME:  Kaitlyn Odom, Kaitlyn F MR#:  [id number] DATE OF BIRTH:  17-Aug-1930  DATE OF ADMISSION:  05/20/2011  PRIMARY CARE PHYSICIAN: Dr. Julieanne Mansonichard Gilbert  CHIEF COMPLAINT: Shortness of breath, cough, wheezing.   HISTORY OF PRESENT ILLNESS: Patient is a 81 year old white female with history of coronary artery disease, sleep apnea, previous history of CVA, hypertension, macular degeneration, hypothyroidism, anxiety who has been having cough and shortness of breath for the past few days. She was seen by her primary care provider today with these complaints. She was noted to be hypoxic with an oxygen sat of 85% on room air. Also was noted to have significant amount of wheezing on examination therefore she is referred for admission. Patient reports that she has not been feeling well for the past few days. Her shortness of breath has gotten worse since Christmas Eve. She has been coughing up yellow-greenish sputum. She also has had bilateral wheezing over the past few days. Prior to the current episode patient reports that she has not had any issues with wheezing. She has seen Dr. Meredeth IdeFleming as an outpatient for according to her sleep apnea. Patient also has had chronic lower extremity swelling and possible lymphedema. Has been recently treated with doxycycline for a possible cellulitis. She has subjective low grade fevers but was afebrile in her primary care physician's office. She has rib pain related coughing but no left-sided chest pain. Denies any abdominal pain, nausea, vomiting, or diarrhea. Denies any urinary frequency, urgency, or hesitancy.   PAST MEDICAL HISTORY:  1. Cerebrovascular accident without any deficits, this was 7 to 8 years ago.  2. Coronary artery disease, status post one stent.  3. Osteoarthritis.  4. Sleep apnea, she is not sure how much oxygen she uses at nighttime.  5. Macular degeneration.  6. Hypertension.  7. Hypothyroidism.  8. Anxiety.   PAST SURGICAL HISTORY:  1. Status post  back surgery.  2. Status post two vein procedures.  3. Status post cholecystectomy.  4. Status post hysterectomy.  5. Status post carpal tunnel release.  6. Status post thyroidectomy.   ALLERGIES: None.   CURRENT MEDICATIONS:  1. Doxycycline 100, 1 tab p.o. b.i.d., has finished seven days, was supposed to be on 14 days. 2. Aspirin 80, 1 tab p.o. daily.  3. Meloxicam 15 mg half tab p.o. b.i.d.  4. Lyrica 50, 1 tab p.o. daily.  5. Klor-Con 20 mEq 1 tab p.o. daily.  6. Lisinopril 40 daily.  7. Lasix 80, 1 tab p.o. b.i.d.  8. Metoprolol 12.5 b.i.d.  9. Plavix 75 p.o. daily.  10. Synthroid 200 mcg daily.  11. Wellbutrin XL 150, 1 tab p.o. daily.   SOCIAL HISTORY: She reports she smokes 1 to 2 packs every three months, prior to that she was smoking heavily, 1 pack per day for multiple years. No alcohol. Lives at an adult independent living community.   FAMILY HISTORY: Positive for hypertension.    REVIEW OF SYSTEMS: CONSTITUTIONAL: Complains of subjective fever, fatigue, and weakness. Denies any pain. No weight loss. No weight gain. EYES: No blurred or double vision. No pain. No redness. No inflammation. No glaucoma. No cataracts. ENT: No tinnitus. No ear pain. No hearing loss. No allergies. No epistaxis. No nasal discharge. No difficulty with swallowing. RESPIRATORY: Complains of productive cough which is yellow. Also has wheezing. No hemoptysis. Has dyspnea. No previous history of diagnosis of chronic obstructive pulmonary disease. Reports has recurrent bronchitis especially during winter months. No history of tuberculosis. CARDIOVASCULAR: Denies any chest  pain. No orthopnea. Does have chronic edema. No syncope. No tachycardia. No palpitations. GASTROINTESTINAL: No nausea, vomiting, or diarrhea. No hematemesis. No hematochezia. No melena. No ulcers. GENITOURINARY: Denies any dysuria, hematuria, renal calculus, frequency or incontinence. ENDOCRINE: Denies any polyuria, nocturia. Does have  hypothyroidism. HEME/LYMPH: Denies any anemia, easy bruisability, or bleeding. SKIN: Has some redness in both of her lower extremities which is chronic. No change in mole, hair or skin. MUSCULOSKELETAL: Denies pain in neck, back, or shoulder. NEUROLOGICAL: No numbness. No weakness. No dysarthria. Has history of CVA without any residual. PSYCHIATRIC: Does have history of anxiety but no depression or any other psychiatric disorders.   PHYSICAL EXAMINATION: VITAL SIGNS: Temperature 97.9, pulse 80, respirations 20, blood pressure 118/76 in primary M.D.'s office, oxygen 85% on room air.    GENERAL: Patient is a 81 year old obese female currently with mild respiratory difficulties.   HEENT: Head atraumatic, normocephalic. Pupils equally round, reactive to light and accommodation. Extraocular movements intact. Oropharynx: There are no exudates. Ear exam shows no erythema or drainage. Nasal exam shows no ulceration or drainage.   NECK: Supple. There is no thyromegaly. No carotid bruits.   CARDIOVASCULAR: Regular rate and rhythm. No murmurs, rubs, clicks, or gallops. PMI is not displaced.   LUNGS: Bilateral wheezing throughout with occasional rhonchi. There are no crackles. There is some accessory muscle use which is very mild.   ABDOMEN: Soft, nontender, nondistended. Positive bowel sounds x4. There is no hepatomegaly.   EXTREMITIES: She has chronic lower extremity swelling, has left leg swollen more than right which is chronic for her with some erythema but no warmth.   VASCULAR: Good DP, PT pulses.   LYMPHATICS: No lymph nodes palpable.   NEUROLOGIC: Awake, alert, oriented x3. No focal deficits.   PSYCHIATRIC: Not anxious or depressed.   LABORATORY, DIAGNOSTIC AND RADIOLOGICAL DATA: Labs currently pending.   ASSESSMENT AND PLAN: Patient is a 81 year old white female with history of cerebrovascular accident, coronary artery disease, sleep apnea. No previous diagnosis of chronic obstructive  pulmonary disease. Has shortness of breath for past few days, wheezing, productive cough.  1. Shortness of breath, cough, possibly due to acute bronchitis, possible pneumonia. At this time will go ahead and get a chest x-ray. Place her on IV antibiotics with azithromycin, ceftriaxone. The patient also has significant amount of wheezing, possibly due to chronic obstructive pulmonary disease exacerbation which would be a new diagnosis or bronchospasm from her acute bronchitis or pneumonia. Will treat her with nebulizers around the clock, place her on IV Solu-Medrol and we will also give her antitussives.  2. Coronary artery disease. Continue aspirin, metoprolol, Plavix as taking at home.  3. Hypertension. Continue lisinopril, metoprolol; hold these as needed.  4. Hypothyroidism. Continue Synthroid.  5. CODE STATUS: FULL CODE.  6. Miscellaneous. Will place her on Lovenox for deep vein thrombosis prophylaxis.   TIME SPENT: 35 minutes spent on the history and physical.   ____________________________ Lacie Scotts. Allena Katz, MD shp:cms D: 05/20/2011 20:28:56 ET T: 05/21/2011 06:15:57 ET JOB#: [id number]  cc: Adelle Zachar H. Allena Katz, MD, <Dictator> Richard L. Sullivan Lone, MD Charise Carwin MD ELECTRONICALLY SIGNED 05/29/2011 15:21

## 2014-09-15 NOTE — Consult Note (Signed)
Chief Complaint:   Subjective/Chief Complaint Colonoscopy is unremarkable except for a diminutive polyp, mild diverticulsosi and small internal hemorrhoids. No melena and light green stools were noted throughout the colon.  Impression: No signs of active GI bleed.  Recommendatons: Resume diet. Follow H and H.   Electronic Signatures: Lurline DelIftikhar, Laquesha Holcomb (MD)  (Signed 09-Jan-13 11:52)  Authored: Chief Complaint   Last Updated: 09-Jan-13 11:52 by Lurline DelIftikhar, Naja Apperson (MD)

## 2014-09-15 NOTE — Consult Note (Signed)
Chief Complaint:   Subjective/Chief Complaint Feels better. Still with dark, loose bowel movements. H and H better after PRBC transfusion.  Recommendations: Agree that a colonoscopy is needed due to continued melena and normal EGD. Will tentatively schedule for Wednesday. Discussed with her and she is in full agreement.   VITAL SIGNS/ANCILLARY NOTES: **Vital Signs.:   07-Jan-13 18:38   Vital Signs Type Q 4hr   Temperature Temperature (F) 98.8   Celsius 37.1   Temperature Source oral   Pulse Pulse 57   Pulse source per Dinamap   Respirations Respirations 18   Systolic BP Systolic BP 100   Diastolic BP (mmHg) Diastolic BP (mmHg) 64   Mean BP 76   BP Source Dinamap   Pulse Ox % Pulse Ox % 95   Pulse Ox Activity Level  At rest   Oxygen Delivery Room Air/ 21 %   Routine Hem:  07-Jan-13 04:15    WBC (CBC) 18.2   RBC (CBC) 3.11   Hemoglobin (CBC) 9.2   Hematocrit (CBC) 27.9   Platelet Count (CBC) 155   MCV 90   MCH 29.5   MCHC 33.0   RDW 14.6   Neutrophil % 78.6   Lymphocyte % 10.9   Monocyte % 5.1   Eosinophil % 4.9   Basophil % 0.5   Neutrophil # 14.3   Lymphocyte # 2.0   Monocyte # 0.9   Eosinophil # 0.9   Basophil # 0.1   Electronic Signatures: Lurline DelIftikhar, Kc Summerson (MD)  (Signed 07-Jan-13 18:50)  Authored: Chief Complaint, VITAL SIGNS/ANCILLARY NOTES, Lab Results   Last Updated: 07-Jan-13 18:50 by Lurline DelIftikhar, Damin Salido (MD)

## 2014-09-15 NOTE — Consult Note (Signed)
Brief Consult Note: Comments: Came to see patient. Patient is in radiology. Will attend once back.  Electronic Signatures: Lurline DelIftikhar, Erick Murin (MD)  (Signed 04-Jan-13 15:51)  Authored: Brief Consult Note   Last Updated: 04-Jan-13 15:51 by Lurline DelIftikhar, Dionne Knoop (MD)

## 2014-09-15 NOTE — Discharge Summary (Signed)
PATIENT NAME:  Kaitlyn Odom, Kaitlyn F MR#:  [id number] DATE OF BIRTH:  1931/02/09  DATE OF ADMISSION:  05/28/2011 DATE OF DISCHARGE:  06/02/2011  HISTORY AND PHYSICAL: For a detailed note, please take a look at the History and Physical done by Dr. Margie EgePhichith at admission.   DIAGNOSES AT DISCHARGE:  1. Gastrointestinal bleed, likely upper gastrointestinal bleed, much improved.  2. Anemia due to the gastrointestinal bleed, now resolved.  3. Paroxysmal atrial fibrillation.  4. Persistent leukocytosis, unknown etiology presently.  5. Hypertension.  6. Hypothyroidism.  7. History of previous cerebrovascular accident.   DIET: The patient is being discharged on a low-sodium, low-fat diet.   ACTIVITY: As tolerated.   FOLLOWUP:  1. Follow up in the next 1 to 2 weeks with Dr. Sherrlyn HockPandit from Oncology.  2. Follow up with Dr. Niel HummerIftikhar in the next 1 to 2 weeks.   DISCHARGE MEDICATIONS:  1. Paxil 40 mg daily.  2. Aspirin 81 mg daily, enteric coated. 3. Lisinopril 20 mg daily.  4. Metoprolol tartrate 12.5 mg b.i.d.  5. Lasix 80 mg daily.  6. Synthroid 200 mcg daily.  7. Wellbutrin 150 mg daily.  8. Potassium 20 mEq daily.  9. Lyrica 50 mg at bedtime.  10. Meloxicam 7.5 mg b.i.d.  11. Advair 250/50, 1 puff b.i.d.  12. Spiriva 1 puff daily.   NOTE: The patient is to discontinue taking Plavix.   CONSULTANTS DURING THE HOSPITAL COURSE: Dr. Niel HummerIftikhar from Gastroenterology.   PERTINENT HOSPITAL LABORATORY, DIAGNOSTIC AND RADIOLOGICAL DATA:  A chest x-ray done on admission showing bilateral interstitial thickening with mild interstitial edema versus interstitial pneumonitis.  Doppler of the lower extremities showing no evidence of any deep vein thrombosis.  A CT of the abdomen and pelvis done on January 6th showing probable previous hysterectomy and cholecystectomy.  Trace pleural effusions and presumed bilateral lung base atelectasis.  Two-dimensional echocardiogram done showing ejection fraction to be  greater than 55%, normal LV function. Left ventricular wall motion is normal. RV wall motion is normal.  Urine culture is negative.  Blood culture is negative.  Stool for C. difficile is negative.   HOSPITAL COURSE: The patient is a 81 year old female with medical problems as mentioned above who presented to the hospital from a skilled nursing facility due to nausea, vomiting and melanotic stools.   1. GI bleed: The patient presented to the hospital with melanotic stools and nausea and vomiting, likely thought to be an upper gastrointestinal bleed. The patient presented to the hospital originally with a normal hemoglobin although she was hemoconcentrated and dehydrated. Her hemoglobin after being hydrated with fluids did come down to 9.9. The patient was transfused 2 units of packed red blood cells. Her hemoglobin since transfusion has improved. She was seen in consultation by Dr. Niel HummerIftikhar, from Gastroenterology, who performed an endoscopy and also a colonoscopy on the patient during the hospital course. The upper GI endoscopy just showed a Schatzki's ring. The colonoscopy showed hemorrhoids and a polyp but no evidence of any acute bleeding. The patient likely would benefit from a video capsule endoscopy, but it can be done as an outpatient and will be set up by Dr. Niel HummerIftikhar. The patient presently denies any abdominal pain, any nausea, vomiting, and is tolerating p.o. well.  2. Anemia: This was secondary to a GI bleed. Her hemoglobin went down to as low as 7.7. The patient did receive 2 units of packed red blood cells during her hospital course. She has had no further evidence of bleeding, and  her hemoglobin has remained stable.  3. Persistent leukocytosis: The patient presented to the hospital with a white cell count of 20,000. It went up as high as 28,000. The exact etiology of this is unclear. She was hypotensive initially and it was thought she was septic. She received empiric antibiotics with Zosyn and  vancomycin, although her cultures, and urinalysis, and chest x-ray remained negative. She was, therefore, taken off antibiotics. Despite being off antibiotics, she continues to have a persistent leukocytosis. I obtained a Hematology/Oncology consult. Dr. Sherrlyn Hock did end up seeing the patient. He ordered some routine testing to rule out any evidence of CLL or myeloproliferative disorder. This can be further followed up as an outpatient at the [facility] as Dr. Sherrlyn Hock will see the patient. She is currently afebrile and hemodynamically stable. Her white cell count is down to 18,000.  4. Acute renal failure: This was secondary to the dehydration and also GI bleed. This significantly improved after IV fluid hydration and once her Lasix was held. Clinically, her creatinine is now back to baseline.  5. History of hypothyroidism: The patient was maintained on Synthroid, and she will resume that.  6. History of paroxysmal atrial fibrillation: She did go into a rapid atrial fibrillation rhythm when she was hypotensive, although after getting IV fluids and being started on her metoprolol she is now back down to a sinus rhythm and is maintaining a sinus rhythm. She is likely not a candidate for chronic anticoagulation given her GI bleed and also high fall risk.  7. History of chronic obstructive pulmonary disease: She did not have any evidence of acute chronic obstructive pulmonary disease exacerbation. She will resume Advair and Spiriva as stated.  8. History of previous cerebrovascular accident: The patient was on aspirin and Plavix prior to coming to the hospital, although those were held due to the GI bleed. She will now resume just aspirin, and Plavix is currently being discontinued at this point.  9. Anxiety: The patient was maintained on her Paxil, and she will resume that too.   CODE STATUS:  The patient is a FULL CODE.     TIME SPENT ON DISCHARGE: 40 minutes. ____________________________ Rolly Pancake. Cherlynn Kaiser,  MD vjs:cbb D: 06/02/2011 12:51:51 ET T: 06/02/2011 13:07:27 ET JOB#: [id number] cc: Rolly Pancake. Cherlynn Kaiser, MD, <Dictator> Sandeep R. Sherrlyn Hock, MD Lurline Del, MD Houston Siren MD ELECTRONICALLY SIGNED 06/02/2011 16:07

## 2014-09-15 NOTE — Consult Note (Signed)
Chief Complaint:   Subjective/Chief Complaint EGD done. Normal except a non obstructing Schatzki's ring.  Recommendations: Continue to hold Plavix. Change Protonix to bid. Full liquid diet. Follow H and H and transfuse if needed. Will follow.   Electronic Signatures: Lurline DelIftikhar, Derin Granquist (MD)  (Signed 05-Jan-13 14:55)  Authored: Chief Complaint   Last Updated: 05-Jan-13 14:55 by Lurline DelIftikhar, Ronnita Paz (MD)

## 2014-09-15 NOTE — H&P (Signed)
PATIENT NAME:  Kaitlyn Odom, WISLER MR#:  [id number] DATE OF BIRTH:  07-Jun-1930  DATE OF ADMISSION:  05/28/2011  REFERRING PHYSICIAN:  Dr. Darnelle Catalan   PRIMARY CARE PHYSICIAN: Dr. Julieanne Manson   PRESENTING COMPLAINT: Sent over from [facility] with reports of black stools, nausea, vomiting, and guaiac positive stool.   HISTORY OF PRESENT ILLNESS: Kaitlyn Odom is a 81 year old woman with history of coronary artery disease status post stent, CVA, sleep apnea, tobacco use, chronic obstructive pulmonary disease, hypothyroidism, and hypertension who was recently discharged on 05/26/2011 for management of chronic obstructive pulmonary disease exacerbation and community-acquired pneumonia. The patient was discharged to [facility] for temporary rehab. She reports that while she was in the hospital she did notice black stools and had ongoing loose black stool when she arrived at the rehab facility. She was initially constipated when she first was admitted but was on bowel regimen and laxative and now continues to have the loose stool. She reports left-sided abdominal pain and epigastric pain. She also endorses chest pain down her sternum. She currently denies chest pain but reports pain with palpation. She reports nausea and vomiting but no blood in her vomit. She reports a history of prior low blood count but unclear in what situation. The patient also reports dry mouth, but she endorses having normal appetite and eating regularly. She denies any fevers or chills. Denies any syncope but reports dizziness and presyncope and was diaphoretic with her nausea and vomiting. She also reports left hand jerking but unclear how long it has been going on.   PAST MEDICAL HISTORY:  1. Admitted 05/20/2011 to 05/26/2011 for community-acquired pneumonia and chronic obstructive pulmonary disease exacerbation.  2. History of cerebrovascular accident without residual deficit.  3. Coronary artery disease status post stenting.   4. Osteoarthritis.  5. Obstructive sleep apnea on questionable oxygen supplementation at night.  6. Macular degeneration.  7. Hypertension.  8. Hypothyroidism.  9. Anxiety.   PAST SURGICAL HISTORY:  1. Back surgery.  2. Status post vein procedures times two.  3. Cholecystectomy.  4. Hysterectomy.  5. Carpal tunnel release.  6. Thyroidectomy.   ALLERGIES: No known drug allergies.   MEDICATIONS:  1. Paroxetine 40 mg daily.  2. Aspirin 81 mg daily.  3. Lisinopril 20 mg daily.  4. Metoprolol 25 mg b.i.d.  5. Plavix 75 mg daily.  6. Furosemide 80 mg daily.  7. Levothyroxine 200 mcg daily.  8. Klor-Con M 20 mEq daily.  9. Lyrica 50 mg at bedtime.  10. Meloxicam 7.5 mg b.i.d.  11. Augmentin 875 mg b.i.d.  12. Prednisone taper.  13. Bupropion XL 150 mg daily.  14. Budesonide suspension nebulizer b.i.d.  15. Ipratropium nebulizer every six hours as needed.   SOCIAL HISTORY: She reports that she quit smoking prior to her admission. She was previously a heavy smoker, at least 1 pack per day. No alcohol. She currently is getting rehab at [facility]. Previous to that she was living in an adult independent living community.   FAMILY HISTORY: History of hypertension and cancer.     REVIEW OF SYSTEMS: CONSTITUTIONAL: Denies any fevers or chills, endorses nausea and vomiting. EYES: No glaucoma. ENT: No ear pain, epistaxis, or discharge. RESPIRATORY: She endorses shortness of breath, but cough and wheezing are improved. CARDIOVASCULAR: As per history of present illness. Denies any syncope or palpitations.  She reports chronic lower extremity edema, unchanged. GI: As per history of present illness. GU:  No dysuria or hematuria. ENDO: No polyuria  or polydipsia. HEME: She has easy bruising. SKIN: No ulcers.  MUSCULOSKELETAL: Reports low back pain and leg pain that is chronic. NEUROLOGIC: No one-sided weakness or numbness. Reports left hand jerking. PSYCH: Endorses anxiety but no suicidal  ideation.   PHYSICAL EXAMINATION:  VITAL SIGNS: Temperature 97.6, pulse 78, respiratory rate 20, blood pressure 101/65, sating 94% on room air.   GENERAL: Obese, lying in bed, weak-appearing.   HEENT: Normocephalic, atraumatic. Pupils are equal, symmetric. Nares without discharge. Dry mucous membrane.   NECK: Soft and supple. No adenopathy or JVP.   CARDIOVASCULAR: Non-tachy. No murmurs, rubs, or gallops.   LUNGS: Faint expiratory wheezing. No use of accessory muscles or increased respiratory effort.   ABDOMEN: Soft. She has tenderness on palpation in the epigastric region. No mass appreciated.   EXTREMITIES: 2+ edema and hyperpigmentation. Dorsal pedis pulses intact.   MUSCULOSKELETAL: Difficult to evaluate for effusion. She is tender on palpation of her legs and her joints.   NEUROLOGIC: Symmetrical strength. No focal deficits.   PSYCH: She is alert and oriented. The patient is cooperative.   PERTINENT LABS AND STUDIES: WBC 28.2, hemoglobin 11.2, hematocrit 34.1, platelet 236, MCV 89, glucose 191, BUN 86, creatinine 1.3, sodium 141, potassium 4.4, chloride 100, carbon dioxide 29, calcium 8.3. Troponin 0.02.   ASSESSMENT AND PLAN: Kaitlyn Odom is a 81 year old woman with a history of cerebrovascular accident, coronary artery disease, obstructive sleep apnea, hypertension, hypothyroidism, recently discharged with management of chronic obstructive pulmonary disease exacerbation and  community-acquired pneumonia, returns with reports of chest pain, nausea, vomiting, and loose, black stools and positive guaiac from rehab.  1. Questionable GI bleed:  The patient is with a two-point drop in hemoglobin from discharge, also with dehydration may actually be hemoconcentrated and hemoglobin could possibly be lower than what is presenting. We will continue to cycle her  hemoglobin and continue with hydration. Type and screen and transfuse if needed. We will obtain a GI consultation. We will hold her  steroids. Start on Protonix drip. Hold aspirin, Plavix, and meloxicam. Hold bowel regimen.  2. Renal insufficiency and dehydration: Presenting creatinine was normal at 0.86 on 12/28. Now with elevated creatinine of 1.3 with BUN 86. We will hold lisinopril, furosemide, and potassium  supplementation.  Follow daily creatinine and I's and O's.  3. Chest pain, atypical:  As above, holding aspirin and Plavix. Pain is reproducible. Low suspicion for acute coronary syndrome but continue to cycle cardiac enzymes and continue on telemonitor. Restart beta blocker.  4. Chronic obstructive pulmonary disease and pneumonia: As above, holding steroids. Restart SVNs, oxygen as needed, Pulmicort, Augmentin for two more days. Leukocytosis slightly worse from discharge, likely with ongoing steroid use, dehydration, and stress. Continue to follow for now.  5. Obstructive sleep apnea on O2 at bedtime: Needs followup to reassess with Dr. Meredeth IdeFleming.  6. Prophylaxis with TEDs and SCDs.   TIME SPENT: Approximately 50 minutes were spent on patient care.   ____________________________ Reuel DerbyAlounthith Angelika Jerrett, MD ap:bjt D: 05/28/2011 02:28:14 ET T: 05/28/2011 09:05:48 ET JOB#: [id number]  cc: Zoraya Fiorenza, MD, <Dictator> Richard L. Sullivan LoneGilbert, MD Reuel DerbyALOUNTHITH Naomee Nowland MD ELECTRONICALLY SIGNED 06/12/2011 2:24
